# Patient Record
Sex: Male | Born: 1985 | Race: Black or African American | Hispanic: No | Marital: Single | Smoking: Former smoker
Health system: Southern US, Community
[De-identification: ages and names within clinical notes are randomized; demographics above are authoritative.]

## PROBLEM LIST (undated history)

## (undated) ENCOUNTER — Emergency Department (HOSPITAL_COMMUNITY): Admission: EM | Discharge status: Absent without leave | Payer: Self-pay

## (undated) DIAGNOSIS — J45909 Unspecified asthma, uncomplicated: Secondary | ICD-10-CM

## (undated) DIAGNOSIS — K219 Gastro-esophageal reflux disease without esophagitis: Secondary | ICD-10-CM

## (undated) HISTORY — DX: Gastro-esophageal reflux disease without esophagitis: K21.9

---

## 2004-08-04 ENCOUNTER — Emergency Department (HOSPITAL_COMMUNITY): Admission: EM | Admit: 2004-08-04 | Discharge: 2004-08-05 | Payer: Self-pay | Admitting: Emergency Medicine

## 2007-08-05 ENCOUNTER — Emergency Department (HOSPITAL_COMMUNITY): Admission: EM | Admit: 2007-08-05 | Discharge: 2007-08-05 | Payer: Self-pay | Admitting: Family Medicine

## 2008-09-04 ENCOUNTER — Emergency Department (HOSPITAL_COMMUNITY): Admission: EM | Admit: 2008-09-04 | Discharge: 2008-09-04 | Payer: Self-pay | Admitting: Emergency Medicine

## 2008-12-21 ENCOUNTER — Emergency Department (HOSPITAL_COMMUNITY): Admission: EM | Admit: 2008-12-21 | Discharge: 2008-12-21 | Payer: Self-pay | Admitting: Emergency Medicine

## 2009-03-04 IMAGING — CR DG CHEST 2V
2 series · 2 of 2 positions shown · non-contrast
Comparison: none

CLINICAL DATA: 21-yaer-old male with chest pain.
 CHEST ? 2 VIEW:

[w chest pa]
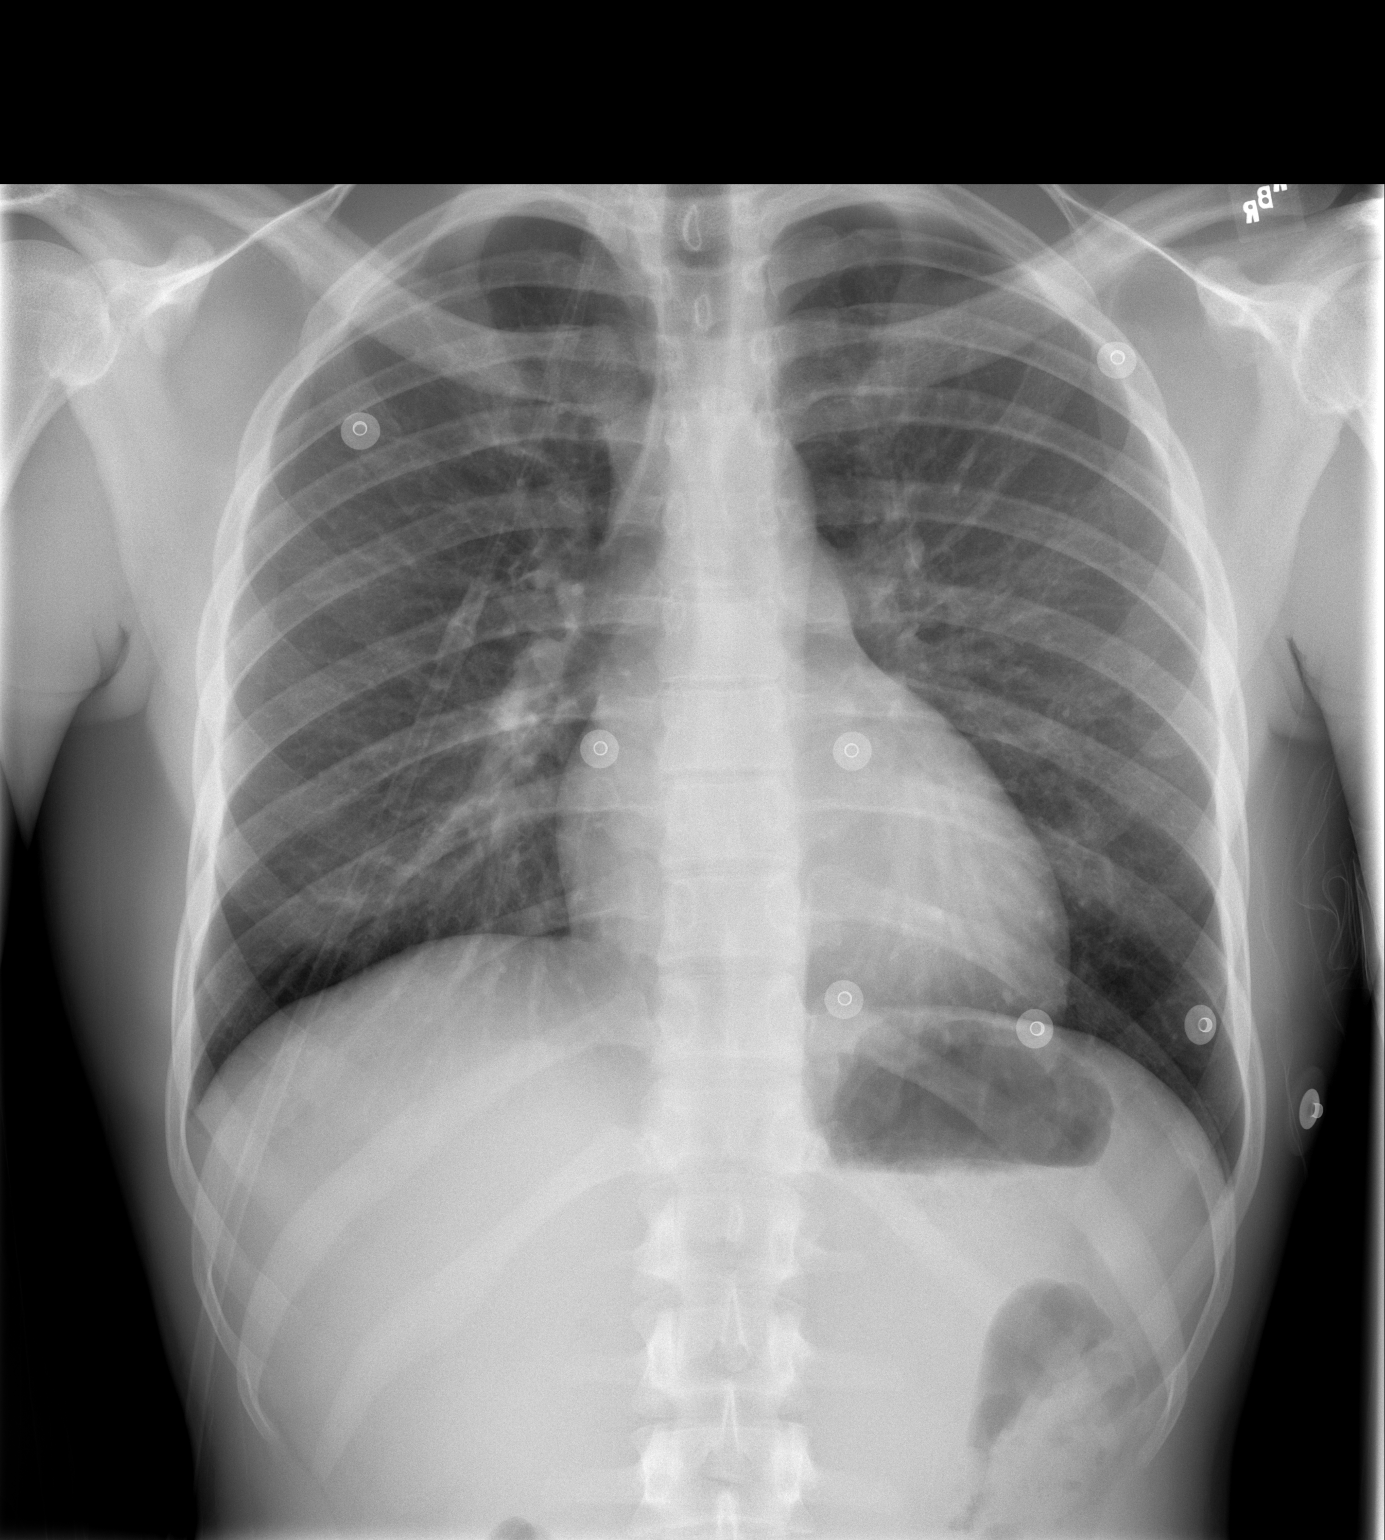

[w chest lat]
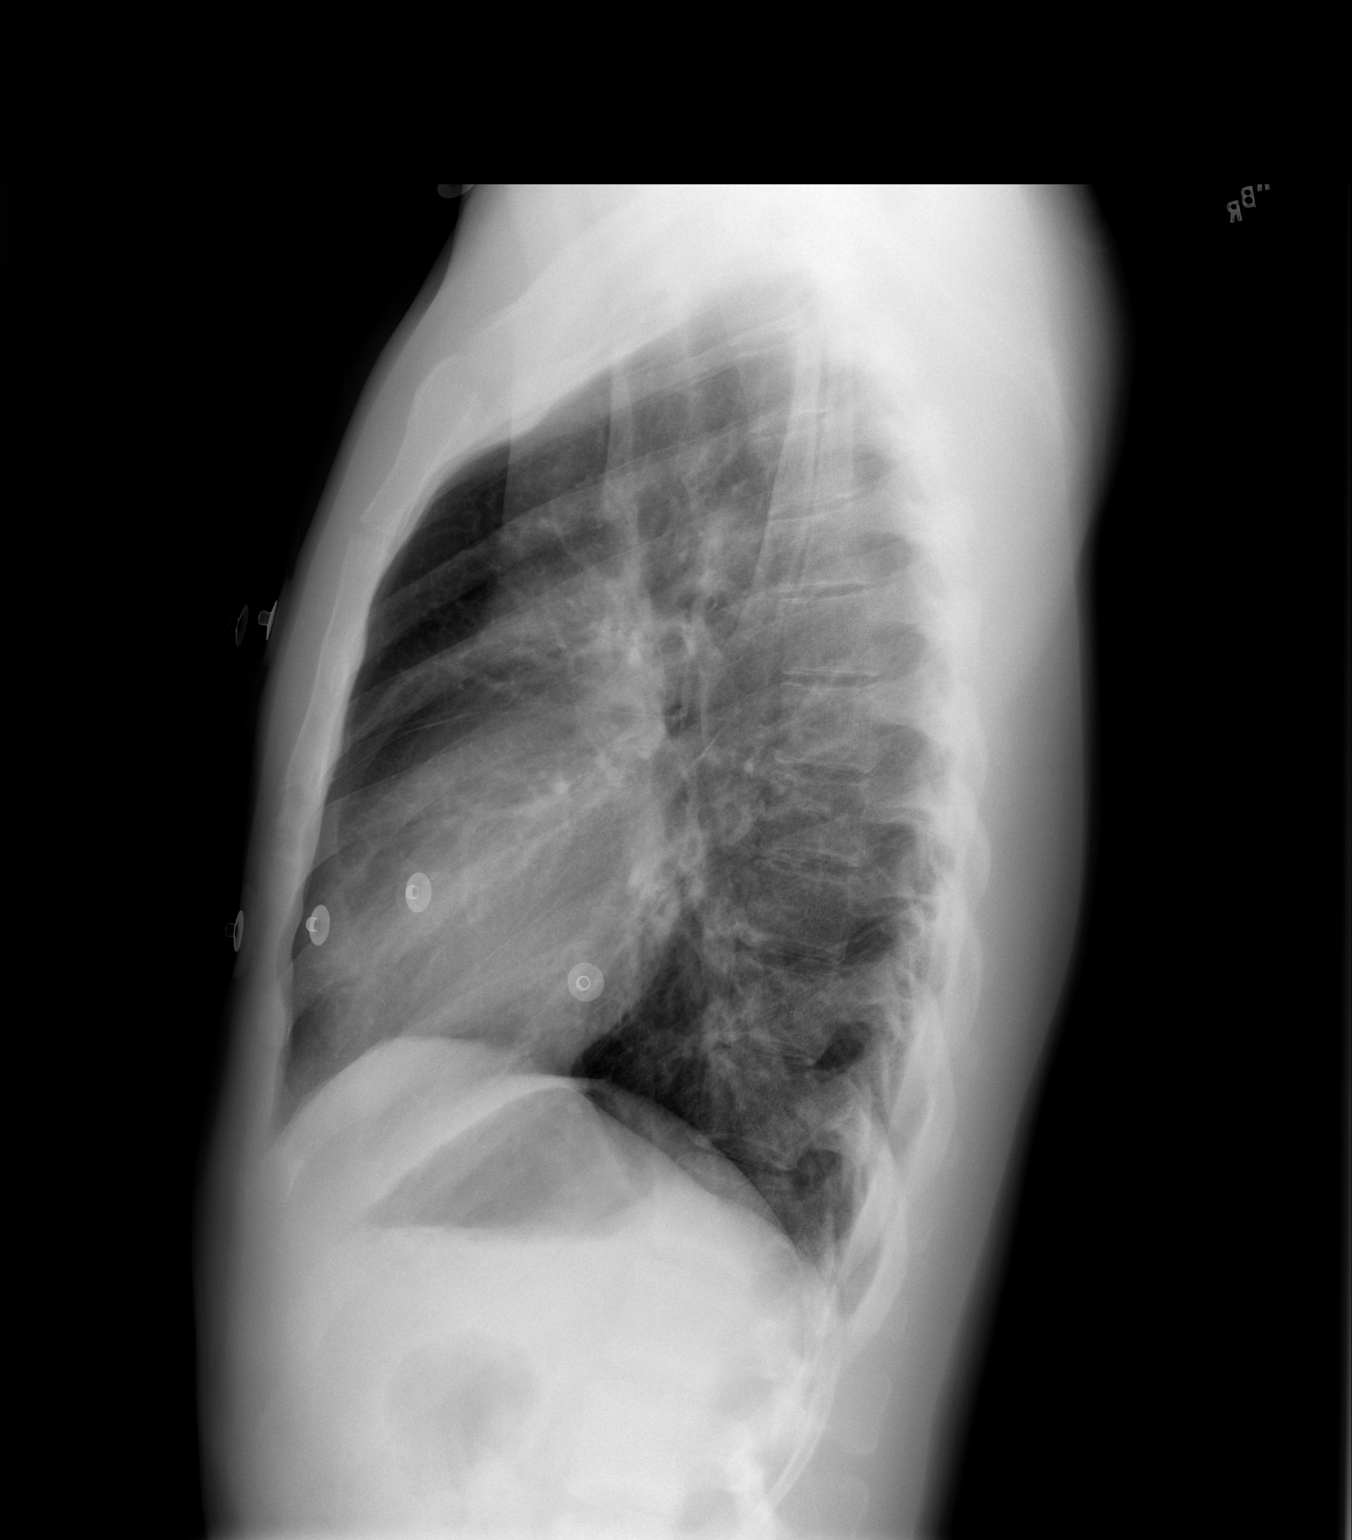

[2 of 2 positions shown; findings below may reference images not displayed]

FINDINGS: The cardio pericardial silhouette is within normal limits for size.  Mild bronchiectatic change is present bilaterally.  No focal airspace disease is present.
IMPRESSION: 1. Mild peribronchial thickening suggestive of an acute viral process.
 2. No focal airspace disease.

## 2009-10-17 ENCOUNTER — Emergency Department (HOSPITAL_COMMUNITY): Admission: EM | Admit: 2009-10-17 | Discharge: 2009-10-17 | Payer: Self-pay | Admitting: Emergency Medicine

## 2009-11-21 ENCOUNTER — Emergency Department (HOSPITAL_COMMUNITY): Admission: EM | Admit: 2009-11-21 | Discharge: 2009-11-21 | Payer: Self-pay | Admitting: Emergency Medicine

## 2009-11-30 ENCOUNTER — Emergency Department (HOSPITAL_COMMUNITY): Admission: EM | Admit: 2009-11-30 | Discharge: 2009-11-30 | Payer: Self-pay | Admitting: Emergency Medicine

## 2010-09-26 LAB — WOUND CULTURE

## 2010-09-26 LAB — GC/CHLAMYDIA PROBE AMP, GENITAL
Chlamydia, DNA Probe: POSITIVE — AB
GC Probe Amp, Genital: POSITIVE — AB

## 2010-09-28 LAB — CULTURE, ROUTINE-ABSCESS

## 2010-10-25 LAB — GC/CHLAMYDIA PROBE AMP, GENITAL: Chlamydia, DNA Probe: NEGATIVE

## 2011-03-30 LAB — POCT CARDIAC MARKERS
CKMB, poc: 1.4
Myoglobin, poc: 102
Myoglobin, poc: 67.1
Operator id: 151321
Troponin i, poc: <0.05

## 2011-03-30 LAB — I-STAT 8, (EC8 V) (CONVERTED LAB)
Acid-Base Excess: 2
Chloride: 104
Glucose, Bld: 93
Potassium: 3.7
Sodium: 139
pH, Ven: 7.365 — ABNORMAL HIGH

## 2011-03-30 LAB — RAPID URINE DRUG SCREEN, HOSP PERFORMED
Amphetamines: NOT DETECTED
Barbiturates: NOT DETECTED
Benzodiazepines: NOT DETECTED
Cocaine: NOT DETECTED
Opiates: NOT DETECTED
Tetrahydrocannabinol: POSITIVE — AB

## 2011-03-30 LAB — POCT I-STAT CREATININE: Operator id: 151321

## 2011-08-27 ENCOUNTER — Encounter (HOSPITAL_COMMUNITY): Payer: Self-pay

## 2011-08-27 ENCOUNTER — Emergency Department (HOSPITAL_COMMUNITY)
Admission: EM | Admit: 2011-08-27 | Discharge: 2011-08-27 | Disposition: A | Discharge status: Home / Self Care | Payer: Self-pay | Attending: Emergency Medicine | Admitting: Emergency Medicine

## 2011-08-27 ENCOUNTER — Emergency Department (HOSPITAL_COMMUNITY): Payer: Self-pay

## 2011-08-27 DIAGNOSIS — S39012A Strain of muscle, fascia and tendon of lower back, initial encounter: Secondary | ICD-10-CM

## 2011-08-27 DIAGNOSIS — M549 Dorsalgia, unspecified: Secondary | ICD-10-CM | POA: Insufficient documentation

## 2011-08-27 DIAGNOSIS — S335XXA Sprain of ligaments of lumbar spine, initial encounter: Secondary | ICD-10-CM | POA: Insufficient documentation

## 2011-08-27 DIAGNOSIS — X503XXA Overexertion from repetitive movements, initial encounter: Secondary | ICD-10-CM | POA: Insufficient documentation

## 2011-08-27 MED ORDER — HYDROCODONE-ACETAMINOPHEN 5-325 MG PO TABS: ORAL_TABLET | ORAL | Status: AC

## 2011-08-27 MED ORDER — NAPROXEN 500 MG PO TABS: 500.0000 mg | ORAL_TABLET | Freq: Two times a day (BID) | ORAL | Status: AC

## 2011-08-27 MED ORDER — METHOCARBAMOL 500 MG PO TABS: ORAL_TABLET | ORAL | Status: DC

## 2011-08-27 NOTE — ED Notes (Signed)
Pt presents with low back pain x 1 week. 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

## 2011-08-27 NOTE — ED Notes (Signed)
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

## 2011-08-27 NOTE — Discharge Instructions (Signed)
Back Pain, Adult Low back pain is very common. About 1 in 5 people have back pain.The cause of low back pain is rarely dangerous. The pain often gets better over time.About half of people with a sudden onset of back pain feel better in just 2 weeks. About 8 in 10 people feel better by 6 weeks.  CAUSES Some common causes of back pain include:  Strain of the muscles or ligaments supporting the spine.   Wear and tear (degeneration) of the spinal discs.   Arthritis.   Direct injury to the back.  DIAGNOSIS Most of the time, the direct cause of low back pain is not known.However, back pain can be treated effectively even when the exact cause of the pain is unknown.Answering your caregiver's questions about your overall health and symptoms is one of the most accurate ways to make sure the cause of your pain is not dangerous. If your caregiver needs more information, he or she may order lab work or imaging tests (X-rays or MRIs).However, even if imaging tests show changes in your back, this usually does not require surgery. HOME CARE INSTRUCTIONS For many people, back pain returns.Since low back pain is rarely dangerous, it is often a condition that people can learn to manageon their own.   Remain active. It is stressful on the back to sit or stand in one place. Do not sit, drive, or stand in one place for more than 30 minutes at a time. Take short walks on level surfaces as soon as pain allows.Try to increase the length of time you walk each day.   Do not stay in bed.Resting more than 1 or 2 days can delay your recovery.   Do not avoid exercise or work.Your body is made to move.It is not dangerous to be active, even though your back may hurt.Your back will likely heal faster if you return to being active before your pain is gone.   Pay attention to your body when you bend and lift. Many people have less discomfortwhen lifting if they bend their knees, keep the load close to their  bodies,and avoid twisting. Often, the most comfortable positions are those that put less stress on your recovering back.   Find a comfortable position to sleep. Use a firm mattress and lie on your side with your knees slightly bent. If you lie on your back, put a pillow under your knees.   Only take over-the-counter or prescription medicines as directed by your caregiver. Over-the-counter medicines to reduce pain and inflammation are often the most helpful.Your caregiver may prescribe muscle relaxant drugs.These medicines help dull your pain so you can more quickly return to your normal activities and healthy exercise.   Put ice on the injured area.   Put ice in a plastic bag.   Place a towel between your skin and the bag.   Leave the ice on for 15 to 20 minutes, 3 to 4 times a day for the first 2 to 3 days. After that, ice and heat may be alternated to reduce pain and spasms.   Ask your caregiver about trying back exercises and gentle massage. This may be of some benefit.   Avoid feeling anxious or stressed.Stress increases muscle tension and can worsen back pain.It is important to recognize when you are anxious or stressed and learn ways to manage it.Exercise is a great option.  SEEK MEDICAL CARE IF:  You have pain that is not relieved with rest or medicine.   You have   pain that does not improve in 1 week.   You have new symptoms.   You are generally not feeling well.  SEEK IMMEDIATE MEDICAL CARE IF:   You have pain that radiates from your back into your legs.   You develop new bowel or bladder control problems.   You have unusual weakness or numbness in your arms or legs.   You develop nausea or vomiting.   You develop abdominal pain.   You feel faint.  Document Released: 06/26/2005 Document Revised: 03/08/2011 Document Reviewed: 11/14/2010 [employer] Patient Information 2012 [employer]. 

## 2011-08-27 NOTE — ED Provider Notes (Signed)
History     CSN: [id number]  Arrival date & time 08/27/11  1112   First MD Initiated Contact with Patient 08/27/11 1208      Chief Complaint  Patient presents with  . Back Pain    (Consider location/radiation/quality/duration/timing/severity/associated sxs/prior treatment) HPI Comments: Patient c/o lower back pain for one week.  Pain began after lifting heavy boxes at work.  Pain is worse with bending over and improves with rest.  He denies fall, numbness or weakness of the legs, incontinence of urine or feces or dysuria.    Patient is a 26 y.o. male presenting with back pain. The history is provided by the patient.  Back Pain  This is a new problem. The current episode started more than 2 days ago. The problem occurs constantly. The problem has not changed since onset.The pain is associated with lifting heavy objects and twisting. The pain is present in the lumbar spine. The quality of the pain is described as aching. The pain does not radiate. The pain is moderate. The symptoms are aggravated by bending, twisting and certain positions. The pain is the same all the time. Pertinent negatives include no numbness, no abdominal pain, no dysuria and no weakness. He has tried nothing for the symptoms. The treatment provided no relief.    History reviewed. No pertinent past medical history.  History reviewed. No pertinent past surgical history.  No family history on file.  History  Substance Use Topics  . Smoking status: Never Smoker   . Smokeless tobacco: Not on file  . Alcohol Use: Yes      Review of Systems  HENT: Negative for neck pain and neck stiffness.   Gastrointestinal: Negative for abdominal pain.  Genitourinary: Negative for dysuria, hematuria, flank pain, decreased urine volume, difficulty urinating and testicular pain.  Musculoskeletal: Positive for back pain. Negative for joint swelling, arthralgias and gait problem.  Skin: Negative.   Neurological: Negative for  weakness and numbness.  All other systems reviewed and are negative.    Allergies  Review of patient's allergies indicates no known allergies.  Home Medications  No current outpatient prescriptions on file.  BP 145/79  Pulse 68  Temp(Src) 97.5 F (36.4 C) (Oral)  Resp 20  Ht 5\' 8"  (1.727 m)  Wt 140 lb (63.504 kg)  BMI 21.29 kg/m2  SpO2 100%  Physical Exam  Nursing note and vitals reviewed. Constitutional: He is oriented to person, place, and time. He appears well-developed and well-nourished. No distress.  HENT:  Head: Normocephalic and atraumatic.  Mouth/Throat: Oropharynx is clear and moist.  Neck: Normal range of motion. Neck supple.  Cardiovascular: Normal rate, regular rhythm, normal heart sounds and intact distal pulses.   No murmur heard. Pulmonary/Chest: Effort normal and breath sounds normal. No respiratory distress.  Musculoskeletal: Normal range of motion. He exhibits no tenderness.       Lumbar back: He exhibits tenderness. He exhibits normal range of motion, no bony tenderness, no swelling, no edema, no deformity, no laceration and normal pulse.       Back:  Neurological: He is alert and oriented to person, place, and time. No cranial nerve deficit. He exhibits normal muscle tone. Coordination and gait normal.  Reflex Scores:      Patellar reflexes are 2+ on the right side and 2+ on the left side.      Achilles reflexes are 2+ on the right side and 2+ on the left side. Skin: Skin is warm and dry.  ED Course  Procedures (including critical care time)       MDM   Patient has tenderness to the lumbar paraspinal muscles. He ambulates with a steady gait. No focal neuro deficits on exam. Likely lumbar strain.  He agrees to f/u with his PMD for recheck.    Patient / Family / Caregiver understand and agree with initial ED impression and plan with expectations set for ED visit. Pt stable in ED with no significant deterioration in condition.           Julis Haubner L. [place], [place] 08/28/11 2019

## 2011-08-29 NOTE — ED Provider Notes (Signed)
Medical screening examination/treatment/procedure(s) were performed by non-physician practitioner and as supervising physician I was immediately available for consultation/collaboration.  Ervin Hensley S. Llewelyn Sheaffer, MD 08/29/11 1458 

## 2012-12-24 ENCOUNTER — Encounter (HOSPITAL_COMMUNITY): Payer: Self-pay | Admitting: Emergency Medicine

## 2012-12-24 ENCOUNTER — Emergency Department (HOSPITAL_COMMUNITY)
Admission: EM | Admit: 2012-12-24 | Discharge: 2012-12-24 | Disposition: A | Discharge status: Home / Self Care | Payer: Self-pay | Attending: Emergency Medicine | Admitting: Emergency Medicine

## 2012-12-24 DIAGNOSIS — Z202 Contact with and (suspected) exposure to infections with a predominantly sexual mode of transmission: Secondary | ICD-10-CM

## 2012-12-24 DIAGNOSIS — F17200 Nicotine dependence, unspecified, uncomplicated: Secondary | ICD-10-CM | POA: Insufficient documentation

## 2012-12-24 DIAGNOSIS — L03211 Cellulitis of face: Secondary | ICD-10-CM | POA: Insufficient documentation

## 2012-12-24 DIAGNOSIS — L0201 Cutaneous abscess of face: Secondary | ICD-10-CM | POA: Insufficient documentation

## 2012-12-24 MED ORDER — SULFAMETHOXAZOLE-TRIMETHOPRIM 800-160 MG PO TABS: 1.0000 | ORAL_TABLET | Freq: Two times a day (BID) | ORAL | Status: DC

## 2012-12-24 MED ORDER — CIPROFLOXACIN HCL 500 MG PO TABS: 500.0000 mg | ORAL_TABLET | Freq: Two times a day (BID) | ORAL | Status: DC

## 2012-12-24 NOTE — ED Provider Notes (Addendum)
History     CSN: [id number]  Arrival date & time 12/24/12  1041   First MD Initiated Contact with Patient 12/24/12 1054      Chief Complaint  Patient presents with  . Abscess  . Exposure to STD    (Consider location/radiation/quality/duration/timing/severity/associated sxs/prior treatment) HPI Comments: Pt states he has developed "another" abscess on the left face. He also states his sexual pardoner notice a "bump" in the privates and he though he should be checked for STD's . No discharge or dysuria, or rash reported. No high  fever. No drainage. Pt has not taken anything for this. He did attempt to squeeze the abscess of the face, but it hurt too much to continue.  Patient is a 27 y.o. male presenting with abscess and STD exposure. The history is provided by the patient.  Abscess Exposure to STD Pertinent negatives include no abdominal pain, arthralgias, chest pain, coughing or neck pain.    History reviewed. No pertinent past medical history.  History reviewed. No pertinent past surgical history.  No family history on file.  History  Substance Use Topics  . Smoking status: Current Every Day Smoker -- 0.50 packs/day    Types: Cigarettes  . Smokeless tobacco: Not on file  . Alcohol Use: Yes      Review of Systems  Constitutional: Negative for activity change.       All ROS Neg except as noted in HPI  HENT: Negative for nosebleeds and neck pain.   Eyes: Negative for photophobia and discharge.  Respiratory: Negative for cough, shortness of breath and wheezing.   Cardiovascular: Negative for chest pain and palpitations.  Gastrointestinal: Negative for abdominal pain and blood in stool.  Genitourinary: Negative for dysuria, frequency, hematuria, discharge, penile swelling, scrotal swelling and penile pain.  Musculoskeletal: Negative for back pain and arthralgias.  Skin: Negative.        abscess  Neurological: Negative for dizziness, seizures and speech difficulty.   Psychiatric/Behavioral: Negative for hallucinations and confusion.    Allergies  Review of patient's allergies indicates no known allergies.  Home Medications   Current Outpatient Rx  Name  Route  Sig  Dispense  Refill  . ciprofloxacin (CIPRO) 500 MG tablet   Oral   Take 1 tablet (500 mg total) by mouth 2 (two) times daily.   14 tablet   0   . sulfamethoxazole-trimethoprim (SEPTRA DS) 800-160 MG per tablet   Oral   Take 1 tablet by mouth every 12 (twelve) hours.   14 tablet   0     BP 152/83  Pulse 77  Temp(Src) 99.5 F (37.5 C)  Resp 18  Ht 5\' 8"  (1.727 m)  Wt 145 lb (65.772 kg)  BMI 22.05 kg/m2  SpO2 100%  Physical Exam  Nursing note and vitals reviewed. Constitutional: He is oriented to person, place, and time. He appears well-developed and well-nourished.  Non-toxic appearance.  HENT:  Head: Normocephalic.  Right Ear: Tympanic membrane and external ear normal.  Left Ear: Tympanic membrane and external ear normal.  nickle size abscess of the left face. No red streaking. No drainage.  Eyes: EOM and lids are normal. Pupils are equal, round, and reactive to light.  Neck: Normal range of motion. Neck supple. Carotid bruit is not present.  Cardiovascular: Normal rate, regular rhythm, normal heart sounds, intact distal pulses and normal pulses.   Pulmonary/Chest: Breath sounds normal. No respiratory distress.  Abdominal: Soft. Bowel sounds are normal. There is no tenderness. There is  no guarding.  Genitourinary: Testes normal. Cremasteric reflex is present. Right testis shows no mass and no tenderness. Left testis shows no mass and no tenderness. Circumcised. No paraphimosis, hypospadias or penile tenderness. No discharge found.  Musculoskeletal: Normal range of motion.  Lymphadenopathy:       Head (right side): No submandibular adenopathy present.       Head (left side): No submandibular adenopathy present.    He has no cervical adenopathy.  Neurological: He is  alert and oriented to person, place, and time. He has normal strength. No cranial nerve deficit or sensory deficit.  Skin: Skin is warm and dry.  Psychiatric: He has a normal mood and affect. His speech is normal.    ED Course  Procedures (including critical care time)  Labs Reviewed  GC/CHLAMYDIA PROBE AMP  RPR   No results found.   1. Abscess of face   2. Possible exposure to STD       MDM  I have reviewed nursing notes, vital signs, and all appropriate lab and imaging results for this patient. The abscess of the left face tender to palpation, but no evidence for severe cellulitis. Rx for ciproa nd septra given. Pt advised to use warm compresses, tylenol and motrin for soreness. Pt to see the surgeon or return to the ED if any changes or problem.       Kathie Dike, PA-C 01/03/13 0934  Kathie Dike, PA-C 01/17/13 1607

## 2012-12-24 NOTE — ED Notes (Signed)
Pt c.o abscess to left face x 3 days. Pt also wants to be checked for std.

## 2012-12-24 NOTE — ED Notes (Signed)
Abscess to lt side of face x3 days,  Also concerned he may have an std., but denies urinary sx or d/c.  Alert, NAD

## 2012-12-25 LAB — GC/CHLAMYDIA PROBE AMP
CT Probe RNA: NEGATIVE
GC Probe RNA: NEGATIVE

## 2013-01-03 NOTE — ED Provider Notes (Signed)
Medical screening examination/treatment/procedure(s) were performed by non-physician practitioner and as supervising physician I was immediately available for consultation/collaboration.  Donnetta Hutching, MD 01/03/13 1550

## 2013-01-18 NOTE — ED Provider Notes (Signed)
Medical screening examination/treatment/procedure(s) were performed by non-physician practitioner and as supervising physician I was immediately available for consultation/collaboration.  Donnetta Hutching, MD 01/18/13 1537

## 2013-03-14 ENCOUNTER — Emergency Department (HOSPITAL_COMMUNITY)
Admission: EM | Admit: 2013-03-14 | Discharge: 2013-03-14 | Disposition: A | Discharge status: Home / Self Care | Payer: Self-pay | Attending: Emergency Medicine | Admitting: Emergency Medicine

## 2013-03-14 ENCOUNTER — Encounter (HOSPITAL_COMMUNITY): Payer: Self-pay | Admitting: Emergency Medicine

## 2013-03-14 DIAGNOSIS — F17200 Nicotine dependence, unspecified, uncomplicated: Secondary | ICD-10-CM | POA: Insufficient documentation

## 2013-03-14 DIAGNOSIS — K5289 Other specified noninfective gastroenteritis and colitis: Secondary | ICD-10-CM | POA: Insufficient documentation

## 2013-03-14 DIAGNOSIS — K529 Noninfective gastroenteritis and colitis, unspecified: Secondary | ICD-10-CM

## 2013-03-14 MED ORDER — PROMETHAZINE HCL 12.5 MG PO TABS: 12.5000 mg | ORAL_TABLET | Freq: Four times a day (QID) | ORAL | Status: DC | PRN

## 2013-03-14 NOTE — ED Notes (Signed)
PA at bedside.

## 2013-03-14 NOTE — ED Provider Notes (Signed)
CSN: [id number]     Arrival date & time 03/14/13  0904 History   First MD Initiated Contact with Patient 03/14/13 [phone]     Chief Complaint  Patient presents with  . Abdominal Pain   (Consider location/radiation/quality/duration/timing/severity/associated sxs/prior Treatment) Patient is a 27 y.o. male presenting with abdominal pain. The history is provided by the patient.  Abdominal Pain Pain location:  Epigastric Pain quality comment:  Feels like a knot in the upper stomach Pain radiates to:  Does not radiate Pain severity:  Moderate Onset quality:  Gradual Duration:  1 day Timing:  Intermittent Progression:  Unchanged Chronicity:  New Context: sick contacts   Relieved by:  Nothing Ineffective treatments:  Acetaminophen Associated symptoms: diarrhea and vomiting   Associated symptoms: no belching, no chest pain, no chills, no constipation, no cough, no dysuria, no fever, no hematuria and no shortness of breath   Risk factors: alcohol abuse and NSAID use   Risk factors: no aspirin use and has not had multiple surgeries     History reviewed. No pertinent past medical history. History reviewed. No pertinent past surgical history. History reviewed. No pertinent family history. History  Substance Use Topics  . Smoking status: Current Every Day Smoker -- 0.50 packs/day    Types: Cigarettes  . Smokeless tobacco: Not on file  . Alcohol Use: Yes     Comment: occ    Review of Systems  Constitutional: Negative for fever, chills and activity change.       All ROS Neg except as noted in HPI  HENT: Negative for nosebleeds and neck pain.   Eyes: Negative for photophobia and discharge.  Respiratory: Negative for cough, shortness of breath and wheezing.   Cardiovascular: Negative for chest pain and palpitations.  Gastrointestinal: Positive for vomiting, abdominal pain and diarrhea. Negative for constipation and blood in stool.  Genitourinary: Negative for dysuria, frequency and  hematuria.  Musculoskeletal: Negative for back pain and arthralgias.  Skin: Negative.   Neurological: Negative for dizziness, seizures and speech difficulty.  Psychiatric/Behavioral: Negative for hallucinations and confusion.    Allergies  Review of patient's allergies indicates no known allergies.  Home Medications   Current Outpatient Rx  Name  Route  Sig  Dispense  Refill  . ciprofloxacin (CIPRO) 500 MG tablet   Oral   Take 1 tablet (500 mg total) by mouth 2 (two) times daily.   14 tablet   0   . sulfamethoxazole-trimethoprim (SEPTRA DS) 800-160 MG per tablet   Oral   Take 1 tablet by mouth every 12 (twelve) hours.   14 tablet   0    BP 138/79  Pulse 73  Temp(Src) 98.8 F (37.1 C) (Oral)  Resp 19  SpO2 100% Physical Exam  Nursing note and vitals reviewed. Constitutional: He is oriented to person, place, and time. He appears well-developed and well-nourished.  Non-toxic appearance.  HENT:  Head: Normocephalic.  Right Ear: Tympanic membrane and external ear normal.  Left Ear: Tympanic membrane and external ear normal.  Eyes: EOM and lids are normal. Pupils are equal, round, and reactive to light.  Neck: Normal range of motion. Neck supple. Carotid bruit is not present.  Cardiovascular: Normal rate, regular rhythm, normal heart sounds, intact distal pulses and normal pulses.   Pulmonary/Chest: Breath sounds normal. No respiratory distress.  Abdominal: Soft. Bowel sounds are normal. There is no tenderness. There is no guarding.  Musculoskeletal: Normal range of motion.  Lymphadenopathy:       Head (right side):  No submandibular adenopathy present.       Head (left side): No submandibular adenopathy present.    He has no cervical adenopathy.  Neurological: He is alert and oriented to person, place, and time. He has normal strength. No cranial nerve deficit or sensory deficit.  Skin: Skin is warm and dry.  Psychiatric: He has a normal mood and affect. His speech is  normal.    ED Course  Procedures (including critical care time) Labs Review Labs Reviewed - No data to display Imaging Review No results found.  MDM  No diagnosis found. **I have reviewed nursing notes, vital signs, and all appropriate lab and imaging results for this patient.*  Vital signs reviewed. Exam and hx c/w gastroenteritis. Rx for promethazine given to the patient. Pt to return if not improving on clear liquid diet and rest.  Kathie Dike, PA-C 03/17/13 1717

## 2013-03-14 NOTE — ED Notes (Signed)
Pt c/o upper abd pain since yesterday. N/v yesterday x2, none today. Diarrhea x3 yesterday and x 2 today. Mm wet. Nad.

## 2013-03-14 NOTE — ED Notes (Signed)
Patient with no complaints at this time. Respirations even and unlabored. Skin warm/dry. Discharge instructions reviewed with patient at this time. Patient given opportunity to voice concerns/ask questions. Patient discharged at this time and left Emergency Department with steady gait.   

## 2013-03-18 NOTE — ED Provider Notes (Signed)
Medical screening examination/treatment/procedure(s) were performed by non-physician practitioner and as supervising physician I was immediately available for consultation/collaboration.    Vida Roller, MD 03/18/13 1149

## 2013-03-26 IMAGING — CR DG LUMBAR SPINE COMPLETE 4+V
5 series · 5 of 5 positions shown · non-contrast
Comparison: None.

CLINICAL DATA: Back pain after lifting

LUMBAR SPINE - COMPLETE 4+ VIEW

[view not recorded (1 of 5)]
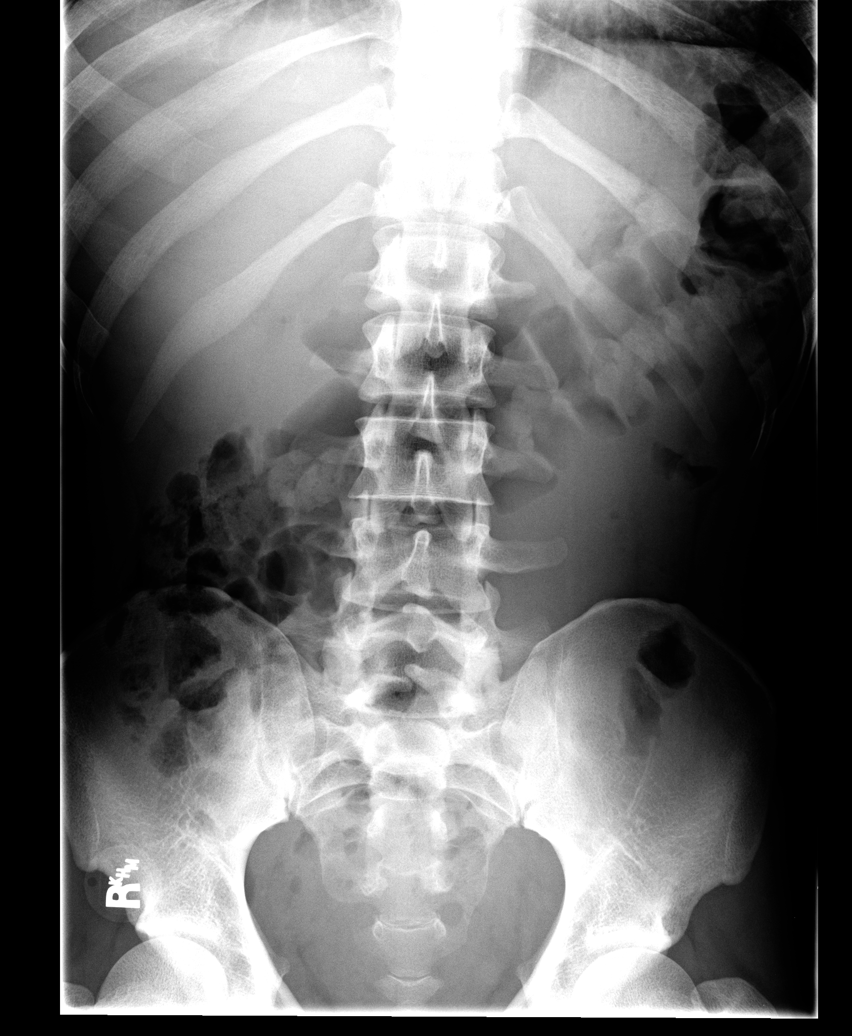

[view not recorded (2 of 5)]
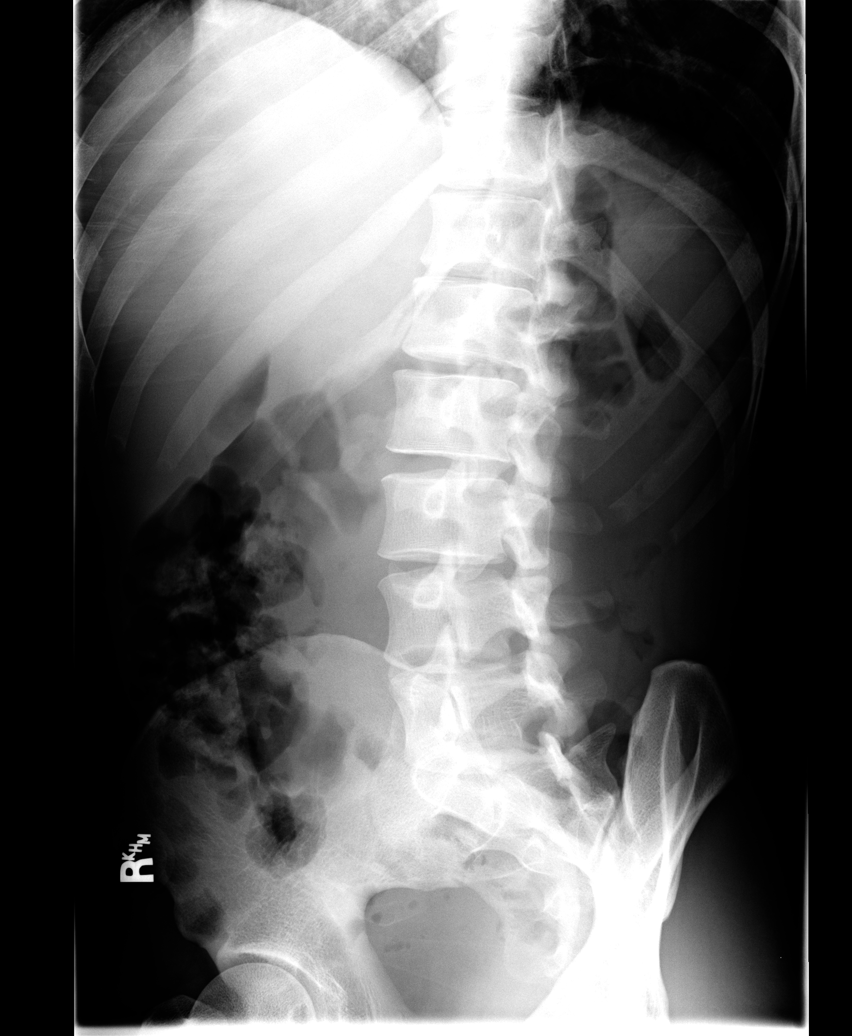

[view not recorded (3 of 5)]
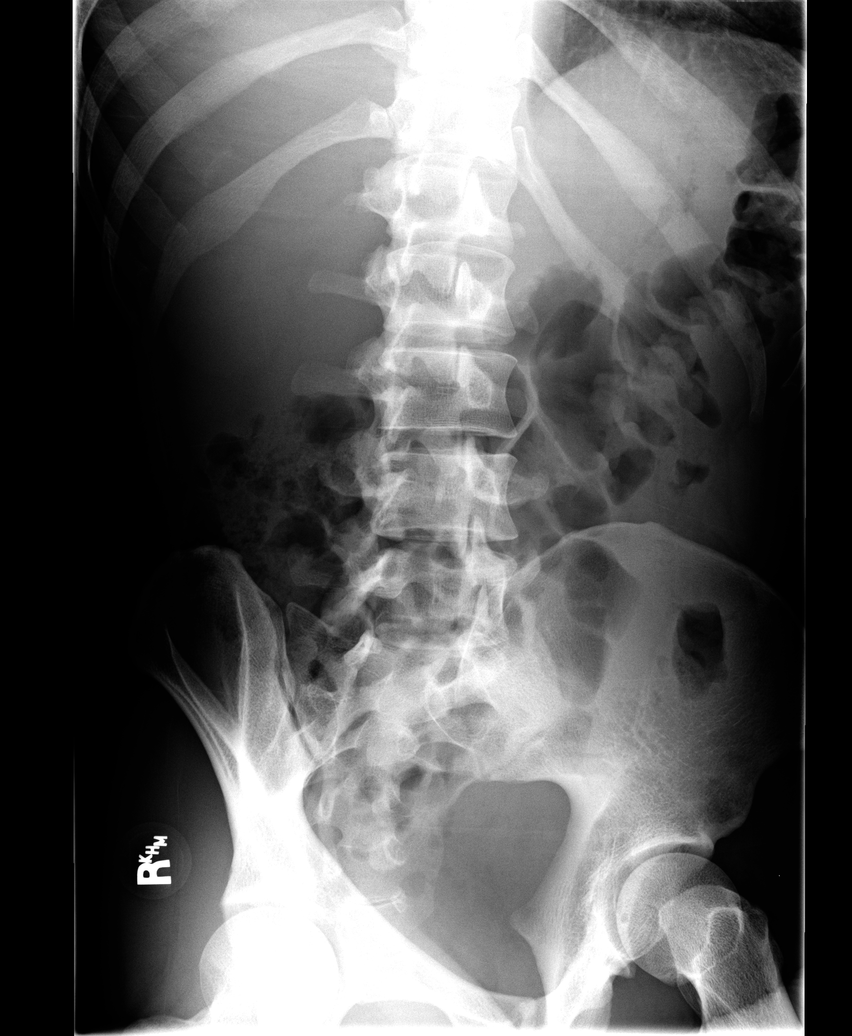

[view not recorded (4 of 5)]
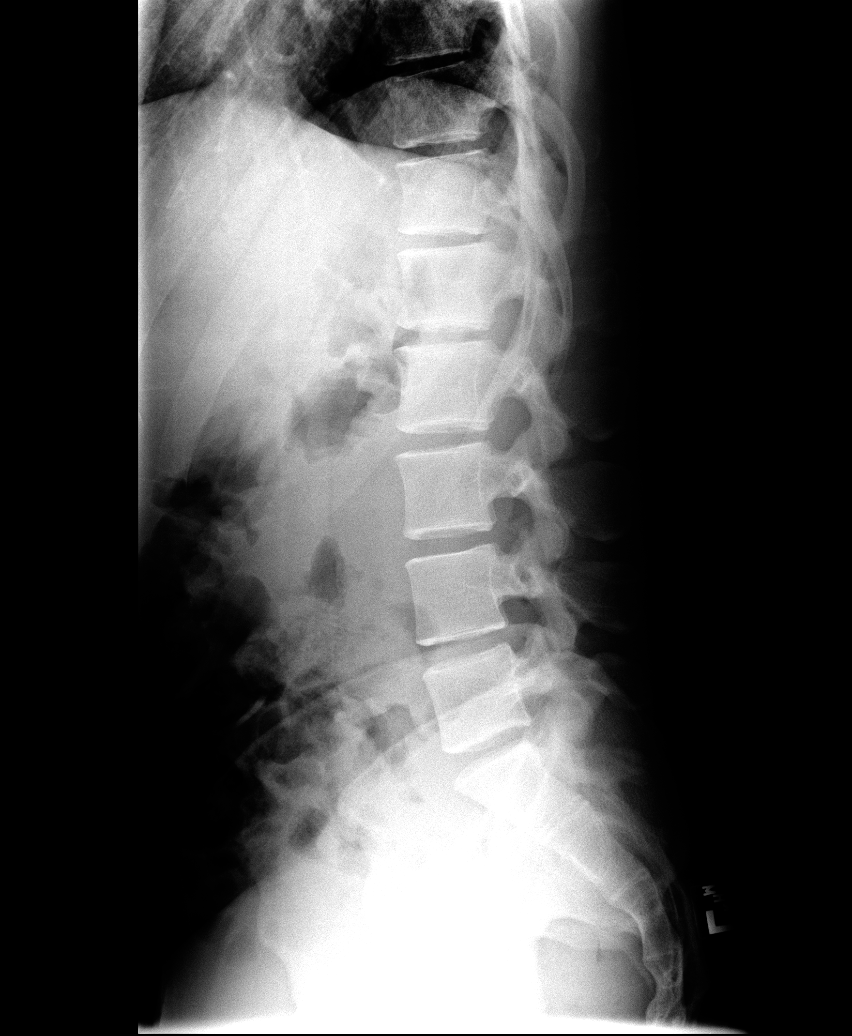

[view not recorded (5 of 5)]
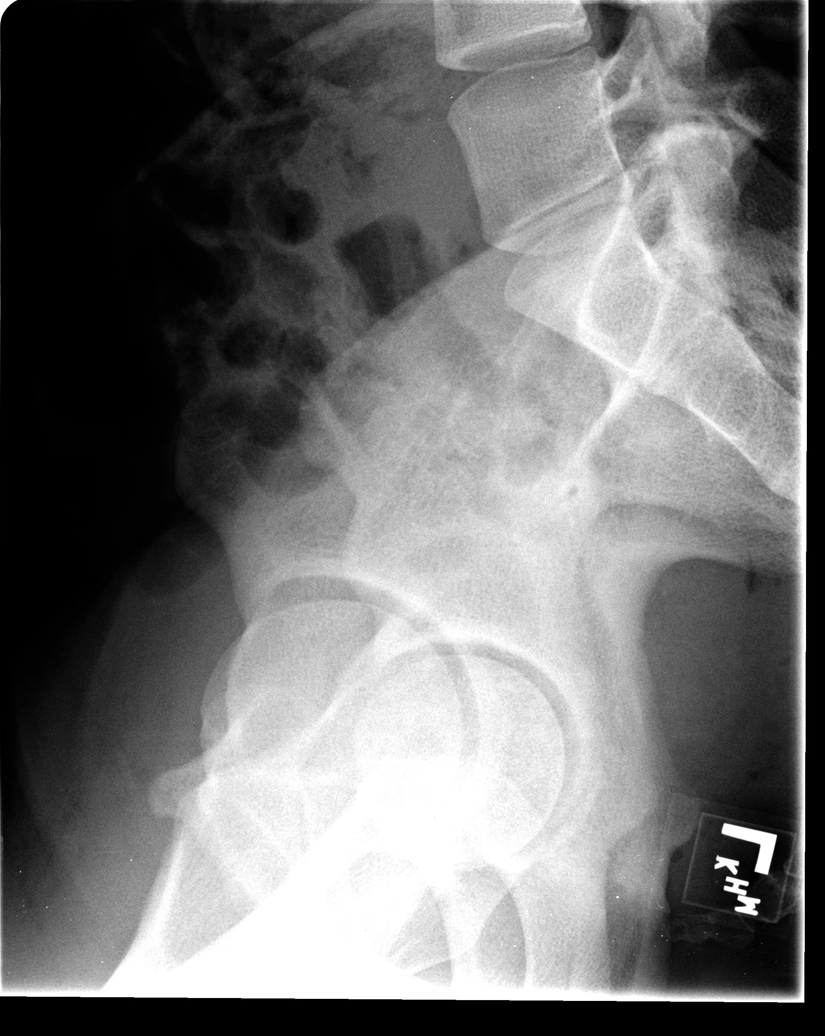

[5 of 5 positions shown; findings below may reference images not displayed]

FINDINGS: There is no evidence of lumbar spine fracture.  Alignment
is normal.  Intervertebral disc spaces are maintained.
IMPRESSION: Negative.

## 2013-05-05 ENCOUNTER — Emergency Department (HOSPITAL_COMMUNITY): Payer: Worker's Compensation

## 2013-05-05 ENCOUNTER — Encounter (HOSPITAL_COMMUNITY): Payer: Self-pay | Admitting: Emergency Medicine

## 2013-05-05 ENCOUNTER — Emergency Department (HOSPITAL_COMMUNITY)
Admission: EM | Admit: 2013-05-05 | Discharge: 2013-05-05 | Disposition: A | Discharge status: Home / Self Care | Payer: Worker's Compensation | Attending: Emergency Medicine | Admitting: Emergency Medicine

## 2013-05-05 DIAGNOSIS — X500XXA Overexertion from strenuous movement or load, initial encounter: Secondary | ICD-10-CM | POA: Insufficient documentation

## 2013-05-05 DIAGNOSIS — S46912A Strain of unspecified muscle, fascia and tendon at shoulder and upper arm level, left arm, initial encounter: Secondary | ICD-10-CM

## 2013-05-05 DIAGNOSIS — S39012A Strain of muscle, fascia and tendon of lower back, initial encounter: Secondary | ICD-10-CM

## 2013-05-05 DIAGNOSIS — IMO0002 Reserved for concepts with insufficient information to code with codable children: Secondary | ICD-10-CM | POA: Insufficient documentation

## 2013-05-05 DIAGNOSIS — F17200 Nicotine dependence, unspecified, uncomplicated: Secondary | ICD-10-CM | POA: Insufficient documentation

## 2013-05-05 DIAGNOSIS — Y990 Civilian activity done for income or pay: Secondary | ICD-10-CM | POA: Insufficient documentation

## 2013-05-05 DIAGNOSIS — S335XXA Sprain of ligaments of lumbar spine, initial encounter: Secondary | ICD-10-CM | POA: Insufficient documentation

## 2013-05-05 DIAGNOSIS — Y9389 Activity, other specified: Secondary | ICD-10-CM | POA: Insufficient documentation

## 2013-05-05 DIAGNOSIS — Y9289 Other specified places as the place of occurrence of the external cause: Secondary | ICD-10-CM | POA: Insufficient documentation

## 2013-05-05 MED ORDER — CYCLOBENZAPRINE HCL 5 MG PO TABS: 5.0000 mg | ORAL_TABLET | Freq: Three times a day (TID) | ORAL | Status: DC | PRN

## 2013-05-05 MED ORDER — IBUPROFEN 600 MG PO TABS: 600.0000 mg | ORAL_TABLET | Freq: Four times a day (QID) | ORAL | Status: DC | PRN

## 2013-05-05 NOTE — ED Notes (Signed)
Pt reports injuring his low back and left shoulder while at work on sat. Was pulling something out of a machine.

## 2013-05-05 NOTE — ED Notes (Signed)
Pt alert & oriented x4, stable gait. Patient given discharge instructions, paperwork & prescription(s). Patient  instructed to stop at the registration desk to finish any additional paperwork. Patient verbalized understanding. Pt left department w/ no further questions. 

## 2013-05-05 NOTE — ED Provider Notes (Signed)
CSN: [id number]     Arrival date & time 05/05/13  0807 History   First MD Initiated Contact with Patient 05/05/13 0815     Chief Complaint  Patient presents with  . Back Pain  . Shoulder Pain   (Consider location/radiation/quality/duration/timing/severity/associated sxs/prior Treatment) HPI Comments: Lee Meyer is a 27 y.o. Male presenting with left shoulder and lower back pain after injury sustained when pulling forcefully trying to unlodge something from a machine at work 2 days ago.  He denies prior pain or injury in his lower back,  But has had left shoulder injury in the past which was never evaluated.  He denies weakness or numbness in his extremities and denies urinary or fecal incontinence or retention.  He has used icy hot on his shoulder without relief of symptoms.  Pain is better at rest,  Triggered with movement.     The history is provided by the patient.    History reviewed. No pertinent past medical history. History reviewed. No pertinent past surgical history. No family history on file. History  Substance Use Topics  . Smoking status: Current Every Day Smoker -- 0.50 packs/day    Types: Cigarettes  . Smokeless tobacco: Not on file  . Alcohol Use: Yes     Comment: occ    Review of Systems  Constitutional: Negative for fever.  Musculoskeletal: Positive for arthralgias and back pain. Negative for joint swelling and myalgias.  Neurological: Negative for weakness and numbness.    Allergies  Review of patient's allergies indicates no known allergies.  Home Medications   Current Outpatient Rx  Name  Route  Sig  Dispense  Refill  . cyclobenzaprine (FLEXERIL) 5 MG tablet   Oral   Take 1 tablet (5 mg total) by mouth 3 (three) times daily as needed for muscle spasms.   15 tablet   0   . ibuprofen (ADVIL,MOTRIN) 600 MG tablet   Oral   Take 1 tablet (600 mg total) by mouth every 6 (six) hours as needed for pain.   30 tablet   0    BP 133/79  SpO2  100% Physical Exam  Nursing note and vitals reviewed. Constitutional: He appears well-developed and well-nourished.  HENT:  Head: Normocephalic.  Eyes: Conjunctivae are normal.  Neck: Normal range of motion. Neck supple.  Cardiovascular: Normal rate and intact distal pulses.   Pedal pulses normal.  Pulmonary/Chest: Effort normal.  Abdominal: Soft. Bowel sounds are normal. He exhibits no distension and no mass.  Musculoskeletal: Normal range of motion. He exhibits no edema.       Left shoulder: He exhibits bony tenderness. He exhibits normal range of motion, no swelling, no effusion, no crepitus, no deformity, no spasm, normal pulse and normal strength.       Lumbar back: He exhibits tenderness. He exhibits no swelling, no edema and no spasm.  Left shoulder ttp across lateral clavicle and upper humerus.  No deformity.  Neurological: He is alert. He has normal strength. He displays no atrophy and no tremor. No sensory deficit. Gait normal.  Reflex Scores:      Patellar reflexes are 2+ on the right side and 2+ on the left side.      Achilles reflexes are 2+ on the right side and 2+ on the left side. No strength deficit noted in hip and knee flexor and extensor muscle groups.  Ankle flexion and extension intact.  Skin: Skin is warm and dry.  Psychiatric: He has a normal mood and  affect.    ED Course  Procedures (including critical care time) Labs Review Labs Reviewed - No data to display Imaging Review Dg Lumbar Spine Complete  05/05/2013   CLINICAL DATA:  low back pain  EXAM: LUMBAR SPINE - COMPLETE 4+ VIEW  COMPARISON:  August 27, 2011  FINDINGS: Frontal, lateral, spot lumbosacral lateral, and bilateral oblique views were obtained. There are 5 non-rib-bearing lumbar type vertebral bodies. There is no fracture or spondylolisthesis. Disk spaces appear intact. There is no appreciable facet arthropathy. 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  IMPRESSION: No fracture or  spondylolisthesis. No appreciable arthropathic change.   Electronically Signed   By: Bretta Bang M.D.   On: 05/05/2013 09:03   Dg Shoulder Left  05/05/2013   CLINICAL DATA:  Superior shoulder pain, work related injury  EXAM: LEFT SHOULDER - 2+ VIEW  COMPARISON:  None.  FINDINGS: No fracture or dislocation is seen.  The joint spaces are preserved.  The visualized soft tissues are unremarkable.  Visualized left lung is clear.  IMPRESSION: No fracture or dislocation is seen.   Electronically Signed   By: Charline Bills M.D.   On: 05/05/2013 09:03    EKG Interpretation   None       MDM   1. Lumbar strain, initial encounter   2. Shoulder strain, left, initial encounter    Patients labs and/or radiological studies were viewed and considered during the medical decision making and disposition process.  Pt prescribed flexeril, ibuprofen.  No neuro deficit on exam or by history to suggest emergent or surgical presentation.  Also discussed worsened sx that should prompt immediate re-evaluation including distal weakness, bowel/bladder retention/incontinence.  Referral to ortho for a recheck if not improved over the next week.         Burgess Amor, PA-C 05/05/13 [phone]

## 2013-05-05 NOTE — ED Provider Notes (Signed)
Medical screening examination/treatment/procedure(s) were performed by non-physician practitioner and as supervising physician I was immediately available for consultation/collaboration.  EKG Interpretation   None      '  Trecia Maring L Ercil Cassis, MD 05/05/13 1434 

## 2013-09-29 ENCOUNTER — Emergency Department (HOSPITAL_COMMUNITY)
Admission: EM | Admit: 2013-09-29 | Discharge: 2013-09-29 | Disposition: A | Discharge status: Home / Self Care | Payer: Self-pay | Attending: Emergency Medicine | Admitting: Emergency Medicine

## 2013-09-29 ENCOUNTER — Encounter (HOSPITAL_COMMUNITY): Payer: Self-pay | Admitting: Emergency Medicine

## 2013-09-29 DIAGNOSIS — Z202 Contact with and (suspected) exposure to infections with a predominantly sexual mode of transmission: Secondary | ICD-10-CM | POA: Insufficient documentation

## 2013-09-29 DIAGNOSIS — F17200 Nicotine dependence, unspecified, uncomplicated: Secondary | ICD-10-CM | POA: Insufficient documentation

## 2013-09-29 LAB — HIV ANTIBODY (ROUTINE TESTING W REFLEX): HIV: NONREACTIVE

## 2013-09-29 MED ORDER — CEFTRIAXONE SODIUM 250 MG IJ SOLR
250.0000 mg | Freq: Once | INTRAMUSCULAR | Status: AC
  Administered 2013-09-29: 250 mg via INTRAMUSCULAR
  Filled 2013-09-29: qty 250

## 2013-09-29 MED ORDER — LIDOCAINE HCL (PF) 1 % IJ SOLN
INTRAMUSCULAR | Status: AC
  Administered 2013-09-29: 0.9 mL
  Filled 2013-09-29: qty 5

## 2013-09-29 MED ORDER — AZITHROMYCIN 1 G PO PACK
1.0000 g | PACK | Freq: Once | ORAL | Status: AC
  Administered 2013-09-29: 1 g via ORAL
  Filled 2013-09-29: qty 1

## 2013-09-29 NOTE — ED Notes (Signed)
Pt here for STD check. Denies sx.

## 2013-09-29 NOTE — ED Provider Notes (Signed)
CSN: [id number]     Arrival date & time 09/29/13  0754 History   First MD Initiated Contact with Patient 09/29/13 0813     Chief Complaint  Patient presents with  . SEXUALLY TRANSMITTED DISEASE     (Consider location/radiation/quality/duration/timing/severity/associated sxs/prior Treatment) The history is provided by the patient. No language interpreter was used.  Pt is a 28 year old male who presents this am desiring STD testing. He reports that his partner tested positive for chlamydia. He denies any dysuria, penile burning or discharge, abdominal pain or back pain. He reports that he has been feeling fine, no recent illness. He reports that this has been a long term partner and she tested positive 2 days ago.   History reviewed. No pertinent past medical history. History reviewed. No pertinent past surgical history. No family history on file. History  Substance Use Topics  . Smoking status: Current Every Day Smoker -- 0.50 packs/day    Types: Cigarettes  . Smokeless tobacco: Not on file  . Alcohol Use: Yes     Comment: occ    Review of Systems  Constitutional: Negative for chills.  Genitourinary: Negative for dysuria, urgency, discharge, penile swelling, scrotal swelling, difficulty urinating, penile pain and testicular pain.  Musculoskeletal: Negative for back pain.  Neurological: Negative for weakness.  All other systems reviewed and are negative.      Allergies  Review of patient's allergies indicates no known allergies.  Home Medications  No current outpatient prescriptions on file. BP 139/69  Pulse 73  Temp(Src) 98.1 F (36.7 C)  Resp 20  Ht 5\' 8"  (1.727 m)  Wt 145 lb (65.772 kg)  BMI 22.05 kg/m2  SpO2 100% Physical Exam  Nursing note and vitals reviewed. Constitutional: He is oriented to person, place, and time. He appears well-developed and well-nourished. No distress.  HENT:  Head: Normocephalic and atraumatic.  Eyes: Conjunctivae and EOM are normal.   Cardiovascular: Normal rate, regular rhythm and normal heart sounds.   Pulmonary/Chest: Effort normal and breath sounds normal. No respiratory distress. He has no wheezes.  Abdominal: Soft. Bowel sounds are normal. He exhibits no distension. There is no tenderness.  Genitourinary: Penis normal. Circumcised. No penile erythema or penile tenderness. No discharge found.  Musculoskeletal: Normal range of motion.  Neurological: He is alert and oriented to person, place, and time.  Skin: Skin is warm and dry. No rash noted.  Psychiatric: He has a normal mood and affect. His behavior is normal. Judgment and thought content normal.    ED Course  Procedures (including critical care time) Labs Review Labs Reviewed - No data to display Imaging Review No results found.   EKG Interpretation None      MDM   Final diagnoses:  Exposure to STD     Exposure to STD, reports that his partner tested positive for chlamydia. Pt asymptomatic. No dysuria, penile pain or discharge. Discussed plan of care with pt. Treated with Azithromycin 1 gm po and ceftriaxone 250 mg IM. Return precautions given.     Irish EldersKelly Usama Harkless, NP 10/01/13 2224

## 2013-09-29 NOTE — ED Notes (Signed)
nad noted prior to dc. Dc instructions reviewed and explained. Voiced understanding. Ambulated out without difficulty.  

## 2013-09-29 NOTE — Discharge Instructions (Signed)
Chlamydia, Females and Males Chlamydia is an infection that can be found in the vagina, urethra, cervix, rectum and pelvic organs in the male. In the male, it most often causes urethritis. This happens when it infects the tube (urethra) that carries the urine out of the bladder. When Chlamydia causes urethritis, there may be burning with urination. In males, it may also infect the tubes that carry the sperm from the testicle. This causes pain in the testicles and infect the prostate gland. In females, an infection of the pelvic organs is also called PID (pelvic inflammatory disease). PID may be a cause of sudden (acute) lower abdominal/belly (pelvic) pain and fever. But with Chlamydia, the infection sometimes does not cause problems that you notice (asymptomatic). It may cause an abnormal or watery mucous-like discharge from the birth canal (vagina) or penis.  CAUSES  Chlamydia is caused by germs (bacteria) that are spread during sexual contact of the:  Genitals.  Mouth.  Rectum. This infection may also be passed to a newborn baby coming through the infected birth canal. This causes eye and lung infections in the baby. Chlamydia often goes unnoticed. So it is easy to transmit it to a sexual partner without even knowing. SYMPTOMS  In females, symptoms may go unnoticed. Symptoms that are more noticeable can include:  Belly (abdominal) pain.  Painful intercourse.  Watery mucous-like discharge from the vagina.  Miscarriage.  Discomfort when urinating.  Inflammation of the rectum. In males, symptoms include:  Burning with urination.  Pain in the testicles.  Watery mucous-like discharge from the penis. It can cause longstanding (chronic) pelvic pain after frequent infections. TREATMENT   Chlamydia can be treated with medications which kill germs(antibiotics).  Inform all sexual partners about the infection. All sexual contacts need to be treated.  If you are pregnant, do not take  tetracycline type antibiotics.  PID can cause women to not be able to have children (sterile) if left untreated or if half-treated. It does this by scarring the tubes to the uterus (fallopian tubes). They carry the egg needed to form a baby. It is important to finish ALL medications given to you.  Sterility or future tubal (ectopic) pregnancies can occur in fully treated individuals. It is important to follow your prescribed treatment. That will lessen the chances of these problems.  This is a sexually transmitted infection. So you are also at risk for other sexually transmitted diseases. These include: Gonorrhea and HIV (AIDS). Testing may be done for the other sexually transmitted diseases if one disease is detected.  It is important to treat chlamydia as soon as possible. It can cause damage to other organs. HOME CARE INSTRUCTIONS  Finish all medication as prescribed. Incomplete treatment will put you at risk for not being able to have children (sterility) and tubal pregnancy. If one sexually transmitted disease is discovered, often treatment will be started to cover other possible infections.  Only take over-the-counter or prescription medicines for pain, discomfort, or fever as directed by your caregiver.  Rest.  Eat a balanced diet and drink plenty of fluids.  Warning: This infection is contagious. Do not have sex until treatment is completed. Follow up at your caregiver's office or the clinic to which you were referred. If your diagnosis (learning what is wrong) is confirmed by culture or some other method, your recent sexual contacts need treatment. Even if they are symptom free or have a negative culture or evaluation, they should be treated.  For the protection of your privacy,  test results can not be given over the phone. Make sure you receive the results of your test. Ask how these results are to be obtained if you have not been informed. It is your responsibility to obtain your test  results. PREVENTION   Women should use sanitary pads instead of tampons for vaginal discharge.  Wipe front to back after using the toilet and avoid douching.  Test for chlamydia if you are having an IUD inserted.  Practice safe sex, use condoms, have only one sex partner and be sure your sex partner is not having sex with others.  Ask your caregiver to test you for chlamydia at your regular checkups or sooner if you are having symptoms.  Ask for further information if you are pregnant. SEEK IMMEDIATE MEDICAL CARE IF:   You develop an oral temperature above 102 F (38.9 C), not controlled by medications or lasting more than 2 days.  You develop an increase in pain.  You develop any type of abnormal discharge.  You develop vaginal bleeding and it is not time for your period.  You develop painful intercourse. MAKE SURE YOU:   Understand these instructions.  Will watch your condition.  Will get help right away if you are not doing well or get worse. Document Released: 06/26/2005 Document Revised: 09/18/2011 Document Reviewed: 01/02/2013 [facility] Patient Information 2014 [employer], [place].   Return if symptoms worsen You were treated here today for this

## 2013-09-30 LAB — GC/CHLAMYDIA PROBE AMP
CT PROBE, AMP APTIMA: NEGATIVE
GC PROBE AMP APTIMA: NEGATIVE

## 2013-10-06 NOTE — ED Provider Notes (Signed)
Medical screening examination/treatment/procedure(s) were performed by non-physician practitioner and as supervising physician I was immediately available for consultation/collaboration.   EKG Interpretation None        Lasondra Hodgkins W. Bentlee Benningfield, MD 10/06/13 0731 

## 2013-11-07 ENCOUNTER — Emergency Department (HOSPITAL_COMMUNITY)
Admission: EM | Admit: 2013-11-07 | Discharge: 2013-11-07 | Disposition: A | Discharge status: Home / Self Care | Payer: Self-pay | Attending: Emergency Medicine | Admitting: Emergency Medicine

## 2013-11-07 ENCOUNTER — Encounter (HOSPITAL_COMMUNITY): Payer: Self-pay | Admitting: Emergency Medicine

## 2013-11-07 DIAGNOSIS — K529 Noninfective gastroenteritis and colitis, unspecified: Secondary | ICD-10-CM

## 2013-11-07 DIAGNOSIS — F17200 Nicotine dependence, unspecified, uncomplicated: Secondary | ICD-10-CM | POA: Insufficient documentation

## 2013-11-07 DIAGNOSIS — K5289 Other specified noninfective gastroenteritis and colitis: Secondary | ICD-10-CM | POA: Insufficient documentation

## 2013-11-07 LAB — CBC WITH DIFFERENTIAL/PLATELET
BASOS PCT: 1 % (ref 0–1)
Basophils Absolute: 0 10*3/uL (ref 0.0–0.1)
Eosinophils Absolute: 0.2 10*3/uL (ref 0.0–0.7)
Eosinophils Relative: 4 % (ref 0–5)
HCT: 39 % (ref 39.0–52.0)
HEMOGLOBIN: 14 g/dL (ref 13.0–17.0)
LYMPHS PCT: 45 % (ref 12–46)
Lymphs Abs: 2.3 10*3/uL (ref 0.7–4.0)
MCH: 28.2 pg (ref 26.0–34.0)
MCHC: 35.9 g/dL (ref 30.0–36.0)
MCV: 78.6 fL (ref 78.0–100.0)
MONOS PCT: 11 % (ref 3–12)
Monocytes Absolute: 0.6 10*3/uL (ref 0.1–1.0)
NEUTROS ABS: 2 10*3/uL (ref 1.7–7.7)
NEUTROS PCT: 39 % — AB (ref 43–77)
Platelets: 154 10*3/uL (ref 150–400)
RBC: 4.96 MIL/uL (ref 4.22–5.81)
RDW: 13.8 % (ref 11.5–15.5)
WBC: 5.1 10*3/uL (ref 4.0–10.5)

## 2013-11-07 LAB — COMPREHENSIVE METABOLIC PANEL
ALT: 32 U/L (ref 0–53)
AST: 28 U/L (ref 0–37)
Albumin: 4 g/dL (ref 3.5–5.2)
Alkaline Phosphatase: 71 U/L (ref 39–117)
BUN: 11 mg/dL (ref 6–23)
CO2: 29 meq/L (ref 19–32)
Calcium: 9.5 mg/dL (ref 8.4–10.5)
Chloride: 105 mEq/L (ref 96–112)
Creatinine, Ser: 0.79 mg/dL (ref 0.50–1.35)
GFR calc non Af Amer: >90 mL/min (ref >90)
Glucose, Bld: 84 mg/dL (ref 70–99)
POTASSIUM: 4.1 meq/L (ref 3.7–5.3)
Sodium: 142 mEq/L (ref 137–147)
TOTAL PROTEIN: 7.2 g/dL (ref 6.0–8.3)
Total Bilirubin: 0.2 mg/dL — ABNORMAL LOW (ref 0.3–1.2)

## 2013-11-07 MED ORDER — DICYCLOMINE HCL 20 MG PO TABS: ORAL_TABLET | ORAL | Status: DC

## 2013-11-07 NOTE — ED Provider Notes (Signed)
CSN: [id number]     Arrival date & time 11/07/13  1324 History   This chart was scribed for Benny LennertJoseph L Laureano Hetzer, MD by Tana ConchStephen Methvin, ED Scribe. This patient was seen in room APA06/APA06 and the patient's care was started at 1:45 PM .    Chief Complaint  Patient presents with  . Abdominal Pain      Patient is a 28 y.o. male presenting with abdominal pain and diarrhea. The history is provided by the patient. No language interpreter was used.  Abdominal Pain Pain location:  Periumbilical Pain radiates to:  Does not radiate Pain severity:  Mild Onset quality:  Sudden Duration:  8 hours Timing:  Intermittent Progression:  Partially resolved Chronicity:  New Relieved by:  Nothing Worsened by:  Nothing tried Ineffective treatments:  None tried Associated symptoms: diarrhea   Associated symptoms: no chest pain, no cough, no fatigue, no hematuria, no nausea and no vomiting   Diarrhea Quality:  Unable to specify Severity:  Moderate Onset quality:  Sudden Number of episodes:  7-8 Duration:  8 hours Timing:  Rare Progression:  Unchanged Relieved by:  Nothing Worsened by:  Nothing tried Ineffective treatments:  None tried Associated symptoms: no abdominal pain, no headaches and no vomiting     HPI Comments: Lee Meyer is a 28 y.o. male who presents to the Emergency Department complaining of sudden abdominal pain that began this morning, he has no h/o abdominal pain. He reports associated diarrhea and states he had 7 episodes since this morning. He denies nausea, black or bloody stools, and vomiting.    History reviewed. No pertinent past medical history. History reviewed. No pertinent past surgical history. History reviewed. No pertinent family history. History  Substance Use Topics  . Smoking status: Current Every Day Smoker -- 0.50 packs/day    Types: Cigarettes  . Smokeless tobacco: Not on file  . Alcohol Use: Yes     Comment: occ    Review of Systems  Constitutional:  Negative for appetite change and fatigue.  HENT: Negative for congestion, ear discharge and sinus pressure.   Eyes: Negative for discharge.  Respiratory: Negative for cough.   Cardiovascular: Negative for chest pain.  Gastrointestinal: Positive for diarrhea. Negative for nausea, vomiting and abdominal pain.  Genitourinary: Negative for frequency and hematuria.  Musculoskeletal: Negative for back pain.  Skin: Negative for rash.  Neurological: Negative for seizures and headaches.  Psychiatric/Behavioral: Negative for hallucinations.      Allergies  Review of patient's allergies indicates no known allergies.  Home Medications   Prior to Admission medications   Not on File   BP 147/80  Pulse 65  Temp(Src) 97.9 F (36.6 C) (Oral)  Resp 16  Ht 5\' 8"  (1.727 m)  Wt 148 lb (67.132 kg)  BMI 22.51 kg/m2  SpO2 100% Physical Exam  Nursing note and vitals reviewed. Constitutional: He is oriented to person, place, and time. He appears well-developed.  HENT:  Head: Normocephalic.  Eyes: Conjunctivae and EOM are normal. No scleral icterus.  Neck: Neck supple. No thyromegaly present.  Cardiovascular: Normal rate and regular rhythm.  Exam reveals no gallop and no friction rub.   No murmur heard. Pulmonary/Chest: No stridor. He has no wheezes. He has no rales. He exhibits no tenderness.  Abdominal: He exhibits no distension. There is no tenderness. There is no rebound.  Musculoskeletal: Normal range of motion. He exhibits no edema.  Lymphadenopathy:    He has no cervical adenopathy.  Neurological: He is oriented  to person, place, and time. He exhibits normal muscle tone. Coordination normal.  Skin: No rash noted. No erythema.  Psychiatric: He has a normal mood and affect. His behavior is normal.    ED Course  Procedures (including critical care time)   DIAGNOSTIC STUDIES: Oxygen Saturation is 100% on RA, normal by my interpretation.    COORDINATION OF CARE:   1:48 PM-Discussed  treatment plan which includes labs with pt at bedside and pt agreed to plan.   Labs Review Labs Reviewed - No data to display  Imaging Review No results found.   EKG Interpretation None      MDM   Final diagnoses:  None      I personally performed the services described in this documentation, which was scribed in my presence. The recorded information has been reviewed and is accurate.   The chart was scribed for me under my direct supervision.  I personally performed the history, physical, and medical decision making and all procedures in the evaluation of this patient.[name].    Yanina Knupp L Tylah Mancillas, MD 11/07/13 [phone]

## 2013-11-07 NOTE — Discharge Instructions (Signed)
Drink plenty of fluids,  Follow up next wee if needed

## 2013-11-07 NOTE — ED Notes (Signed)
Pt c/o ruq abd pain with diarrhea. Denies n/v. NAD at this time. Mm wet. denies black or bloody stools.

## 2014-02-24 ENCOUNTER — Encounter (HOSPITAL_COMMUNITY): Payer: Self-pay | Admitting: Emergency Medicine

## 2014-02-24 ENCOUNTER — Emergency Department (HOSPITAL_COMMUNITY)
Admission: EM | Admit: 2014-02-24 | Discharge: 2014-02-24 | Disposition: A | Discharge status: Home / Self Care | Payer: Self-pay | Attending: Emergency Medicine | Admitting: Emergency Medicine

## 2014-02-24 DIAGNOSIS — F17200 Nicotine dependence, unspecified, uncomplicated: Secondary | ICD-10-CM | POA: Insufficient documentation

## 2014-02-24 DIAGNOSIS — Z8719 Personal history of other diseases of the digestive system: Secondary | ICD-10-CM | POA: Insufficient documentation

## 2014-02-24 DIAGNOSIS — R109 Unspecified abdominal pain: Secondary | ICD-10-CM | POA: Insufficient documentation

## 2014-02-24 DIAGNOSIS — R197 Diarrhea, unspecified: Secondary | ICD-10-CM | POA: Insufficient documentation

## 2014-02-24 DIAGNOSIS — R112 Nausea with vomiting, unspecified: Secondary | ICD-10-CM | POA: Insufficient documentation

## 2014-02-24 MED ORDER — LOPERAMIDE HCL 2 MG PO CAPS
2.0000 mg | ORAL_CAPSULE | Freq: Once | ORAL | Status: AC
  Administered 2014-02-24: 2 mg via ORAL
  Filled 2014-02-24: qty 1

## 2014-02-24 MED ORDER — ONDANSETRON 4 MG PO TBDP
4.0000 mg | ORAL_TABLET | Freq: Once | ORAL | Status: AC
  Administered 2014-02-24: 4 mg via ORAL
  Filled 2014-02-24: qty 1

## 2014-02-24 MED ORDER — PROMETHAZINE HCL 25 MG PO TABS: 25.0000 mg | ORAL_TABLET | Freq: Four times a day (QID) | ORAL | Status: DC | PRN

## 2014-02-24 NOTE — Discharge Instructions (Signed)

## 2014-02-24 NOTE — ED Notes (Signed)
Pt c/o n/v/d abd pain that started last night, states that his nephew was sick with same symptoms.

## 2014-02-24 NOTE — ED Notes (Signed)
MD at bedside. 

## 2014-02-24 NOTE — ED Provider Notes (Signed)
TIME SEEN: 3:57 PM  CHIEF COMPLAINT: Nausea, vomiting and diarrhea  HPI: Patient is a 28 year old male with no significant past medical history no history of abdominal surgery who presents emergency department with 2 episodes of nonbloody, nonbilious vomiting last night and 4 episodes of nonbloody diarrhea. He states his nephew has had a viral gastroenteritis and he thinks that he caught it from him. He denies any fevers or chills. No abdominal pain. No dysuria or hematuria. No testicular pain or swelling. No penile discharge. No recent travel or antibiotic use.  ROS: See HPI Constitutional: no fever  Eyes: no drainage  ENT: no runny nose   Cardiovascular:  no chest pain  Resp: no SOB  GI:  vomiting GU: no dysuria Integumentary: no rash  Allergy: no hives  Musculoskeletal: no leg swelling  Neurological: no slurred speech ROS otherwise negative  PAST MEDICAL HISTORY/PAST SURGICAL HISTORY:  History reviewed. No pertinent past medical history.  MEDICATIONS:  Prior to Admission medications   Not on File    ALLERGIES:  No Known Allergies  SOCIAL HISTORY:  History  Substance Use Topics  . Smoking status: Current Every Day Smoker -- 0.50 packs/day    Types: Cigarettes  . Smokeless tobacco: Not on file  . Alcohol Use: Yes     Comment: occ    FAMILY HISTORY: No family history on file.  EXAM: BP 138/73  Pulse 68  Temp(Src) 99.2 F (37.3 C) (Oral)  Resp 16  Ht 5\' 8"  (1.727 m)  Wt 148 lb (67.132 kg)  BMI 22.51 kg/m2  SpO2 100% CONSTITUTIONAL: Alert and oriented and responds appropriately to questions. Well-appearing; well-nourished HEAD: Normocephalic EYES: Conjunctivae clear, PERRL ENT: normal nose; no rhinorrhea; moist mucous membranes; pharynx without lesions noted NECK: Supple, no meningismus, no LAD  CARD: RRR; S1 and S2 appreciated; no murmurs, no clicks, no rubs, no gallops RESP: Normal chest excursion without splinting or tachypnea; breath sounds clear and  equal bilaterally; no wheezes, no rhonchi, no rales,  ABD/GI: Normal bowel sounds; non-distended; soft, non-tender, no rebound, no guarding, no peritoneal signs BACK:  The back appears normal and is non-tender to palpation, there is no CVA tenderness EXT: Normal ROM in all joints; non-tender to palpation; no edema; normal capillary refill; no cyanosis    SKIN: Normal color for age and race; warm NEURO: Moves all extremities equally PSYCH: The patient's mood and manner are appropriate. Grooming and personal hygiene are appropriate.  MEDICAL DECISION MAKING: Patient here with viral gastritis. He is very well-appearing with a normal abdominal exam. He does not appear dehydrated on exam. I do not feel he needs labs, urine at this time as I feel they will not change his disposition. We'll give Zofran and Imodium. We'll by mouth challenge. I do not feel he needs abdominal imaging and has a very benign abdominal exam.  ED PROGRESS: Patient states he is feeling much better and is able to tolerate by mouth. We'll discharge him with return precautions and supportive care instructions and prescription for Phenergan. Verbalize understanding and is comfortable with plan.     Layla MawKristen N Jesly Hartmann, DO 02/24/14 [phone]

## 2014-03-21 ENCOUNTER — Encounter (HOSPITAL_COMMUNITY): Payer: Self-pay | Admitting: Emergency Medicine

## 2014-03-21 ENCOUNTER — Emergency Department (HOSPITAL_COMMUNITY)
Admission: EM | Admit: 2014-03-21 | Discharge: 2014-03-21 | Disposition: A | Discharge status: Home / Self Care | Payer: Self-pay | Attending: Emergency Medicine | Admitting: Emergency Medicine

## 2014-03-21 DIAGNOSIS — Z113 Encounter for screening for infections with a predominantly sexual mode of transmission: Secondary | ICD-10-CM | POA: Insufficient documentation

## 2014-03-21 DIAGNOSIS — F17200 Nicotine dependence, unspecified, uncomplicated: Secondary | ICD-10-CM | POA: Insufficient documentation

## 2014-03-21 LAB — HIV ANTIBODY (ROUTINE TESTING W REFLEX): HIV: NONREACTIVE

## 2014-03-21 LAB — RPR

## 2014-03-21 NOTE — ED Provider Notes (Signed)
CSN: [id number]     Arrival date & time 03/21/14  0809 History   First MD Initiated Contact with Patient 03/21/14 [phone]     Chief Complaint  Patient presents with  . SEXUALLY TRANSMITTED DISEASE     (Consider location/radiation/quality/duration/timing/severity/associated sxs/prior Treatment) HPI  Lee Meyer is a 28 y.o. male presenting for evaluation for possible STD.  He denies any symptoms including no penile or growing lesions, no penile discharge, burning with urination rash, fevers or chills.  His girl friend was diagnosed with a vaginal yeast infection 3 days ago and she prompted him to come in for testing.  He states he last had intercourse with her 5 days ago.     History reviewed. No pertinent past medical history. History reviewed. No pertinent past surgical history. History reviewed. No pertinent family history. History  Substance Use Topics  . Smoking status: Current Every Day Smoker -- 0.50 packs/day    Types: Cigarettes  . Smokeless tobacco: Not on file  . Alcohol Use: Yes     Comment: occ    Review of Systems  Constitutional: Negative for fever.  HENT: Negative for congestion and sore throat.   Eyes: Negative.   Respiratory: Negative for chest tightness and shortness of breath.   Cardiovascular: Negative for chest pain.  Gastrointestinal: Negative for nausea and abdominal pain.  Genitourinary: Negative.  Negative for dysuria, flank pain, discharge, difficulty urinating, genital sores and penile pain.  Musculoskeletal: Negative for arthralgias, joint swelling and neck pain.  Skin: Negative.  Negative for rash and wound.  Neurological: Negative for dizziness, weakness, light-headedness, numbness and headaches.  Psychiatric/Behavioral: Negative.       Allergies  Review of patient's allergies indicates no known allergies.  Home Medications   Prior to Admission medications   Medication Sig Start Date End Date Taking? Authorizing Provider  promethazine  (PHENERGAN) 25 MG tablet Take 1 tablet (25 mg total) by mouth every 6 (six) hours as needed for nausea or vomiting. 02/24/14   Kristen N Ward, DO   BP 116/76  Pulse 71  Temp(Src) 97.8 F (36.6 C) (Oral)  Resp 18  Ht  (1.727 m)  Wt 140 lb (63.504 kg)  BMI 21.29 kg/m2  SpO2 100% Physical Exam  Nursing note and vitals reviewed. Constitutional: He appears well-developed and well-nourished.  HENT:  Head: Normocephalic and atraumatic.  Eyes: Conjunctivae are normal.  Cardiovascular: Normal rate and regular rhythm.   Pulmonary/Chest: Effort normal and breath sounds normal.  Abdominal: Soft. He exhibits no distension.  Genitourinary: Penis normal. Circumcised. No discharge found.  Musculoskeletal: Normal range of motion.  Neurological: He is alert.  Skin: Skin is warm and dry.  Psychiatric: He has a normal mood and affect.    ED Course  Procedures (including critical care time) Labs Review Labs Reviewed  GC/CHLAMYDIA PROBE AMP  RPR  HIV ANTIBODY (ROUTINE TESTING)    Imaging Review No results found.   EKG Interpretation None      MDM   Final diagnoses:  Screening for STD (sexually transmitted disease)    Chaperone was present during exam.  Pt was screened for gc/chlamydia.  Rpr,hiv also drawn.  Pt was advised of symptoms suggesting yeast infection, reassured he did not have this infection.  Cultures pending, patient aware.   Prn f/u anticipated.    Burgess Amor, PA-C 03/21/14 1014

## 2014-03-21 NOTE — ED Notes (Signed)
Pt reports s/o was seen and diagnosed with a yeast infection on Wednesday. Pt reports "i want to be checked to make sure she didn't spread it to me." pt denies any gi/gu symptoms. nad noted.

## 2014-03-21 NOTE — Discharge Instructions (Signed)

## 2014-03-21 NOTE — ED Provider Notes (Signed)
Medical screening examination/treatment/procedure(s) were performed by non-physician practitioner and as supervising physician I was immediately available for consultation/collaboration.   EKG Interpretation None       Donnetta Hutching, MD 03/21/14 1504

## 2014-03-23 LAB — GC/CHLAMYDIA PROBE AMP
CT Probe RNA: NEGATIVE
GC PROBE AMP APTIMA: NEGATIVE

## 2014-10-21 DIAGNOSIS — Z720 Tobacco use: Secondary | ICD-10-CM | POA: Insufficient documentation

## 2014-10-21 DIAGNOSIS — K529 Noninfective gastroenteritis and colitis, unspecified: Secondary | ICD-10-CM | POA: Insufficient documentation

## 2014-10-22 ENCOUNTER — Emergency Department (HOSPITAL_COMMUNITY)
Admission: EM | Admit: 2014-10-22 | Discharge: 2014-10-22 | Disposition: A | Discharge status: Home / Self Care | Payer: Self-pay | Attending: Emergency Medicine | Admitting: Emergency Medicine

## 2014-10-22 ENCOUNTER — Encounter (HOSPITAL_COMMUNITY): Payer: Self-pay

## 2014-10-22 DIAGNOSIS — K529 Noninfective gastroenteritis and colitis, unspecified: Secondary | ICD-10-CM

## 2014-10-22 MED ORDER — IBUPROFEN 400 MG PO TABS
600.0000 mg | ORAL_TABLET | Freq: Once | ORAL | Status: AC
  Administered 2014-10-22: 600 mg via ORAL
  Filled 2014-10-22: qty 2

## 2014-10-22 MED ORDER — PROMETHAZINE HCL 12.5 MG PO TABS
25.0000 mg | ORAL_TABLET | Freq: Once | ORAL | Status: AC
  Administered 2014-10-22: 25 mg via ORAL
  Filled 2014-10-22: qty 2

## 2014-10-22 MED ORDER — PROMETHAZINE HCL 25 MG RE SUPP: 25.0000 mg | Freq: Four times a day (QID) | RECTAL | Status: DC | PRN

## 2014-10-22 NOTE — Discharge Instructions (Signed)

## 2014-10-22 NOTE — ED Notes (Signed)
Pt reports abd pain with vomiting and diarrhea since yesterday

## 2014-10-22 NOTE — ED Provider Notes (Signed)
CSN: [id number]     Arrival date & time 10/21/14  2336 History   First MD Initiated Contact with Patient 10/22/14 0129     Chief Complaint  Patient presents with  . Abdominal Pain     (Consider location/radiation/quality/duration/timing/severity/associated sxs/prior Treatment) HPI Comments: Pt comes in with cc of abd pain, emesis and diarrhea. Abd pain is periumbilical, and intermittent. Symptoms started 1 day ago. + nausea with emesis x 4 and loose BM x 6. No blood. There is no medical problems. No fevers.  Patient is a 29 y.o. male presenting with abdominal pain. The history is provided by the patient.  Abdominal Pain Associated symptoms: diarrhea, nausea and vomiting   Associated symptoms: no chest pain, no cough and no dysuria     History reviewed. No pertinent past medical history. History reviewed. No pertinent past surgical history. No family history on file. History  Substance Use Topics  . Smoking status: Current Every Day Smoker -- 0.50 packs/day    Types: Cigarettes  . Smokeless tobacco: Not on file  . Alcohol Use: Yes     Comment: occ    Review of Systems  Constitutional: Negative for activity change.  Respiratory: Negative for cough.   Cardiovascular: Negative for chest pain.  Gastrointestinal: Positive for nausea, vomiting, abdominal pain and diarrhea.  Genitourinary: Negative for dysuria.  Neurological: Negative for dizziness.      Allergies  Review of patient's allergies indicates no known allergies.  Home Medications   Prior to Admission medications   Medication Sig Start Date End Date Taking? Authorizing Provider  promethazine (PHENERGAN) 25 MG suppository Place 1 suppository (25 mg total) rectally every 6 (six) hours as needed for nausea. 10/22/14   Riot Barrick, MD   BP 138/71 mmHg  Pulse 65  Temp(Src) 98.3 F (36.8 C) (Oral)  Resp 18  Ht 5\' 8"  (1.727 m)  Wt 143 lb (64.864 kg)  BMI 21.75 kg/m2  SpO2 100% Physical Exam  Constitutional:  He is oriented to person, place, and time. He appears well-developed.  HENT:  Head: Normocephalic and atraumatic.  Eyes: Conjunctivae and EOM are normal. Pupils are equal, round, and reactive to light.  Neck: Normal range of motion. Neck supple.  Cardiovascular: Normal rate and regular rhythm.   Pulmonary/Chest: Effort normal and breath sounds normal.  Abdominal: Soft. Bowel sounds are normal. He exhibits no distension. There is tenderness. There is no rebound and no guarding.  Neurological: He is alert and oriented to person, place, and time.  Skin: Skin is warm.  Nursing note and vitals reviewed.   ED Course  Procedures (including critical care time)  ORAL CHALLENGE PASSED   Labs Review Labs Reviewed - No data to display  Imaging Review No results found.   EKG Interpretation None      MDM   Final diagnoses:  Gastroenteritis    Pt with geenralized, intermittent abd pain with emesis and diarrhea. No fevers. No signs of dehydration. + sick contact-  Nephew with similar sx. Suspect viral gastroenteritis.     Derwood KaplanAnkit Nani Ingram, MD 10/22/14 [phone]

## 2014-10-22 NOTE — ED Notes (Signed)
Patient passed  Fluid challenge successfully.

## 2015-03-29 ENCOUNTER — Emergency Department (HOSPITAL_COMMUNITY): Payer: Self-pay

## 2015-03-29 ENCOUNTER — Emergency Department (HOSPITAL_COMMUNITY)
Admission: EM | Admit: 2015-03-29 | Discharge: 2015-03-29 | Disposition: A | Discharge status: Home / Self Care | Payer: Self-pay | Attending: Emergency Medicine | Admitting: Emergency Medicine

## 2015-03-29 ENCOUNTER — Encounter (HOSPITAL_COMMUNITY): Payer: Self-pay

## 2015-03-29 DIAGNOSIS — R079 Chest pain, unspecified: Secondary | ICD-10-CM | POA: Insufficient documentation

## 2015-03-29 DIAGNOSIS — Z720 Tobacco use: Secondary | ICD-10-CM | POA: Insufficient documentation

## 2015-03-29 LAB — CBC
HEMATOCRIT: 40 % (ref 39.0–52.0)
HEMOGLOBIN: 14.3 g/dL (ref 13.0–17.0)
MCH: 28.7 pg (ref 26.0–34.0)
MCHC: 35.8 g/dL (ref 30.0–36.0)
MCV: 80.3 fL (ref 78.0–100.0)
Platelets: 145 10*3/uL — ABNORMAL LOW (ref 150–400)
RBC: 4.98 MIL/uL (ref 4.22–5.81)
RDW: 13.6 % (ref 11.5–15.5)
WBC: 5.9 10*3/uL (ref 4.0–10.5)

## 2015-03-29 LAB — BASIC METABOLIC PANEL
Anion gap: 5 (ref 5–15)
BUN: 14 mg/dL (ref 6–20)
CALCIUM: 8.6 mg/dL — AB (ref 8.9–10.3)
CO2: 28 mmol/L (ref 22–32)
CREATININE: 0.86 mg/dL (ref 0.61–1.24)
Chloride: 105 mmol/L (ref 101–111)
GFR calc Af Amer: >60 mL/min (ref >60)
Glucose, Bld: 97 mg/dL (ref 65–99)
Potassium: 3.8 mmol/L (ref 3.5–5.1)
SODIUM: 138 mmol/L (ref 135–145)

## 2015-03-29 LAB — TROPONIN I

## 2015-03-29 MED ORDER — SODIUM CHLORIDE 0.9 % IV SOLN
1000.0000 mL | INTRAVENOUS | Status: DC
  Administered 2015-03-29: 1000 mL via INTRAVENOUS

## 2015-03-29 MED ORDER — NAPROXEN 500 MG PO TABS: 500.0000 mg | ORAL_TABLET | Freq: Two times a day (BID) | ORAL | Status: DC

## 2015-03-29 NOTE — ED Provider Notes (Signed)
CSN: [id number]     Arrival date & time 03/29/15  4098 History   First MD Initiated Contact with Patient 03/29/15 [phone]     Chief Complaint  Patient presents with  . Chest Pain   Patient is a 29 y.o. male presenting with chest pain. The history is provided by the patient.  Chest Pain Pain location:  R chest Pain quality: sharp   Pain radiates to:  Does not radiate Pain severity:  Moderate Duration: started last wednesday night, constant. Progression:  Waxing and waning Context: breathing and movement   Relieved by:  Nothing Worsened by:  Nothing tried Ineffective treatments: bc powder abd ibuprofen. Associated symptoms: no abdominal pain, no cough, no fever, no nausea, no shortness of breath, not vomiting and no weakness   Risk factors: smoking   Risk factors: no coronary artery disease, no diabetes mellitus, no high cholesterol and no prior DVT/PE     History reviewed. No pertinent past medical history. History reviewed. No pertinent past surgical history. No family history on file. Social History  Substance Use Topics  . Smoking status: Current Every Day Smoker -- 0.50 packs/day    Types: Cigarettes  . Smokeless tobacco: None  . Alcohol Use: Yes     Comment: occ    Review of Systems  Constitutional: Negative for fever.  Respiratory: Negative for cough and shortness of breath.   Cardiovascular: Positive for chest pain.  Gastrointestinal: Negative for nausea, vomiting and abdominal pain.  Neurological: Negative for weakness.  All other systems reviewed and are negative.     Allergies  Review of patient's allergies indicates no known allergies.  Home Medications   Prior to Admission medications   Medication Sig Start Date End Date Taking? Authorizing Provider  naproxen (NAPROSYN) 500 MG tablet Take 1 tablet (500 mg total) by mouth 2 (two) times daily. 03/29/15   Linwood Dibbles, MD   BP 114/72 mmHg  Pulse 63  Temp(Src) 98.1 F (36.7 C) (Oral)  Resp 25  SpO2  100% Physical Exam  Constitutional: He appears well-developed and well-nourished. No distress.  HENT:  Head: Normocephalic and atraumatic.  Right Ear: External ear normal.  Left Ear: External ear normal.  Eyes: Conjunctivae are normal. Right eye exhibits no discharge. Left eye exhibits no discharge. No scleral icterus.  Neck: Neck supple. No tracheal deviation present.  Cardiovascular: Normal rate, regular rhythm and intact distal pulses.   Pulmonary/Chest: Effort normal and breath sounds normal. No stridor. No respiratory distress. He has no wheezes. He has no rales.  Abdominal: Soft. Bowel sounds are normal. He exhibits no distension. There is no tenderness. There is no rebound and no guarding.  Musculoskeletal: He exhibits no edema or tenderness.  Neurological: He is alert. He has normal strength. No cranial nerve deficit (no facial droop, extraocular movements intact, no slurred speech) or sensory deficit. He exhibits normal muscle tone. He displays no seizure activity. Coordination normal.  Skin: Skin is warm and dry. No rash noted.  Psychiatric: He has a normal mood and affect.  Nursing note and vitals reviewed.   ED Course  Procedures (including critical care time) Labs Review Labs Reviewed  CBC - Abnormal; Notable for the following:    Platelets 145 (*)    All other components within normal limits  BASIC METABOLIC PANEL - Abnormal; Notable for the following:    Calcium 8.6 (*)    All other components within normal limits  TROPONIN I    Imaging Review Dg Chest 2 View  03/29/2015   CLINICAL DATA:  Pt reports has had left sided chest pain since Wednesday off and on. Pt says pain is worse with movement. Denies sob or nausea  EXAM: CHEST  2 VIEW  COMPARISON:  08/05/2007.  FINDINGS: The heart size and mediastinal contours are within normal limits. Both lungs are clear. The visualized skeletal structures are unremarkable.  IMPRESSION: No active cardiopulmonary disease.    Electronically Signed   By: Charlett Nose M.D.   On: 03/29/2015 09:05   I have personally reviewed and evaluated these images and lab results as part of my medical decision-making.   EKG Interpretation   Date/Time:  Monday March 29 2015 08:29:13 EDT Ventricular Rate:  84 PR Interval:  131 QRS Duration: 89 QT Interval:  345 QTC Calculation: 408 R Axis:   39 Text Interpretation:  Sinus rhythm RSR' in V1 or V2, probably normal  variant LVH by voltage No significant change since last tracing Confirmed  by KNAPP  MD-J, JON ([id number]) on 03/29/2015 8:34:18 AM      MDM   Final diagnoses:  Chest pain, unspecified chest pain type    Patient is having pleuritic chest pain for the last week. Symptoms are atypical for cardiac disease. Patient is low risk. Cardiac enzymes and EKG are unremarkable.  Low risk for pulmonary embolism. Perc Negative.  At this time there does not appear to be any evidence of an acute emergency medical condition and the patient appears stable for discharge with appropriate outpatient follow up.     Linwood Dibbles, MD 03/29/15 [phone]

## 2015-03-29 NOTE — Discharge Instructions (Signed)

## 2015-03-29 NOTE — ED Notes (Signed)
Pt reports has had left sided chest pain since Wednesday off and on.  Pt says pain is worse with movement.  Denies sob or nausea.

## 2015-09-29 ENCOUNTER — Emergency Department (HOSPITAL_COMMUNITY)
Admission: EM | Admit: 2015-09-29 | Discharge: 2015-09-29 | Disposition: A | Discharge status: Home / Self Care | Payer: Self-pay | Attending: Emergency Medicine | Admitting: Emergency Medicine

## 2015-09-29 ENCOUNTER — Encounter (HOSPITAL_COMMUNITY): Payer: Self-pay | Admitting: Emergency Medicine

## 2015-09-29 DIAGNOSIS — F17210 Nicotine dependence, cigarettes, uncomplicated: Secondary | ICD-10-CM | POA: Insufficient documentation

## 2015-09-29 DIAGNOSIS — J069 Acute upper respiratory infection, unspecified: Secondary | ICD-10-CM | POA: Insufficient documentation

## 2015-09-29 DIAGNOSIS — Z79899 Other long term (current) drug therapy: Secondary | ICD-10-CM | POA: Insufficient documentation

## 2015-09-29 NOTE — ED Notes (Signed)
Patient has had nasal congestion for about 1 week, taking Tylenol cold and cough, without any relief.

## 2015-09-29 NOTE — Discharge Instructions (Signed)
Upper Respiratory Infection, Adult Most upper respiratory infections (URIs) are caused by a virus. A URI affects the nose, throat, and upper air passages. The most common type of URI is often called "the common cold." HOME CARE   Take medicines only as told by your doctor.  Gargle warm saltwater or take cough drops to comfort your throat as told by your doctor.  Use a warm mist humidifier or inhale steam from a shower to increase air moisture. This may make it easier to breathe.  Drink enough fluid to keep your pee (urine) clear or pale yellow.  Eat soups and other clear broths.  Have a healthy diet.  Rest as needed.  Go back to work when your fever is gone or your doctor says it is okay.  You may need to stay home longer to avoid giving your URI to others.  You can also wear a face mask and wash your hands often to prevent spread of the virus.  Use your inhaler more if you have asthma.  Do not use any tobacco products, including cigarettes, chewing tobacco, or electronic cigarettes. If you need help quitting, ask your doctor. GET HELP IF:  You are getting worse, not better.  Your symptoms are not helped by medicine.  You have chills.  You are getting more short of breath.  You have brown or red mucus.  You have yellow or brown discharge from your nose.  You have pain in your face, especially when you bend forward.  You have a fever.  You have puffy (swollen) neck glands.  You have pain while swallowing.  You have white areas in the back of your throat. GET HELP RIGHT AWAY IF:   You have very bad or constant:  Headache.  Ear pain.  Pain in your forehead, behind your eyes, and over your cheekbones (sinus pain).  Chest pain.  You have long-lasting (chronic) lung disease and any of the following:  Wheezing.  Long-lasting cough.  Coughing up blood.  A change in your usual mucus.  You have a stiff neck.  You have changes in  your:  Vision.  Hearing.  Thinking.  Mood. MAKE SURE YOU:   Understand these instructions.  Will watch your condition.  Will get help right away if you are not doing well or get worse.   This information is not intended to replace advice given to you by your health care provider. Make sure you discuss any questions you have with your health care provider.   Continue over-the-counter cold and cough medicine. Return for any new or worse symptoms.   Document Released: 12/13/2007 Document Revised: 11/10/2014 Document Reviewed: 10/01/2013 Elsevier Interactive Patient Education [employer]2016 [employer]

## 2015-09-29 NOTE — ED Provider Notes (Signed)
CSN: [id number]     Arrival date & time 09/29/15  [id number] History   First MD Initiated Contact with Patient 09/29/15 [phone]     Chief Complaint  Patient presents with  . Nasal Congestion     (Consider location/radiation/quality/duration/timing/severity/associated sxs/prior Treatment) The history is provided by the patient.   patient with one-week history of nasal congestion occasional cough but that has resolved. No fevers no nausea no vomiting no diarrhea. Patient's been taking over-the-counter cold and cough which is helping some but has not resolved.  History reviewed. No pertinent past medical history. History reviewed. No pertinent past surgical history. History reviewed. No pertinent family history. Social History  Substance Use Topics  . Smoking status: Current Every Day Smoker -- 0.50 packs/day    Types: Cigarettes  . Smokeless tobacco: None  . Alcohol Use: Yes     Comment: occ    Review of Systems  Constitutional: Negative for fever.  HENT: Positive for congestion. Negative for sore throat.   Eyes: Negative for visual disturbance.  Respiratory: Positive for cough. Negative for shortness of breath and wheezing.   Cardiovascular: Negative for chest pain.  Genitourinary: Negative for dysuria.  Musculoskeletal: Negative for back pain.  Skin: Negative for rash.  Neurological: Negative for headaches.  Hematological: Does not bruise/bleed easily.  Psychiatric/Behavioral: Negative for confusion.      Allergies  Review of patient's allergies indicates no known allergies.  Home Medications   Prior to Admission medications   Medication Sig Start Date End Date Taking? Authorizing Provider  naproxen (NAPROSYN) 500 MG tablet Take 1 tablet (500 mg total) by mouth 2 (two) times daily. 03/29/15   Linwood DibblesJon Knapp, MD   BP 121/81 mmHg  Pulse 74  Temp(Src) 98.4 F (36.9 C) (Oral)  Resp 18  Ht 5\' 8"  (1.727 m)  Wt 66.225 kg  BMI 22.20 kg/m2  SpO2 100% Physical Exam  Constitutional:  He is oriented to person, place, and time. He appears well-developed and well-nourished. No distress.  HENT:  Head: Normocephalic and atraumatic.  Mouth/Throat: Oropharynx is clear and moist. No oropharyngeal exudate.  Eyes: Conjunctivae and EOM are normal. Pupils are equal, round, and reactive to light.  Neck: Normal range of motion. Neck supple.  Cardiovascular: Normal rate, regular rhythm and normal heart sounds.   No murmur heard. Pulmonary/Chest: Effort normal and breath sounds normal. No respiratory distress.  Abdominal: Soft. Bowel sounds are normal. There is no tenderness.  Musculoskeletal: Normal range of motion.  Neurological: He is alert and oriented to person, place, and time. No cranial nerve deficit. He exhibits normal muscle tone. Coordination normal.  Skin: Skin is warm. No rash noted.  Nursing note and vitals reviewed.   ED Course  Procedures (including critical care time) Labs Review Labs Reviewed - No data to display  Imaging Review No results found. I have personally reviewed and evaluated these images and lab results as part of my medical decision-making.   EKG Interpretation None      MDM   Final diagnoses:  URI (upper respiratory infection)    Symptoms consistent with upper respiratory infection. Nontoxic no acute distress.   Vanetta MuldersScott Efton Thomley, MD 09/29/15 [phone]

## 2016-04-02 ENCOUNTER — Emergency Department (HOSPITAL_COMMUNITY)
Admission: EM | Admit: 2016-04-02 | Discharge: 2016-04-02 | Disposition: A | Discharge status: Home / Self Care | Payer: Self-pay | Attending: Emergency Medicine | Admitting: Emergency Medicine

## 2016-04-02 ENCOUNTER — Encounter (HOSPITAL_COMMUNITY): Payer: Self-pay | Admitting: Emergency Medicine

## 2016-04-02 DIAGNOSIS — F17210 Nicotine dependence, cigarettes, uncomplicated: Secondary | ICD-10-CM | POA: Insufficient documentation

## 2016-04-02 DIAGNOSIS — L02411 Cutaneous abscess of right axilla: Secondary | ICD-10-CM

## 2016-04-02 MED ORDER — LIDOCAINE-EPINEPHRINE (PF) 2 %-1:200000 IJ SOLN
10.0000 mL | Freq: Once | INTRAMUSCULAR | Status: AC
  Administered 2016-04-02: 10 mL via INTRADERMAL
  Filled 2016-04-02: qty 20

## 2016-04-02 MED ORDER — HYDROCODONE-ACETAMINOPHEN 5-325 MG PO TABS: ORAL_TABLET | ORAL | 0 refills | Status: DC

## 2016-04-02 MED ORDER — SULFAMETHOXAZOLE-TRIMETHOPRIM 800-160 MG PO TABS: 1.0000 | ORAL_TABLET | Freq: Two times a day (BID) | ORAL | 0 refills | Status: AC

## 2016-04-02 NOTE — ED Provider Notes (Signed)
[facility]-EMERGENCY DEPT Provider Note   CSN: [id number] Arrival date & time: 04/02/16  1059  By signing my name below, I, Lee Meyer, attest that this documentation has been prepared under the direction and in the presence of non-physician practitioner, Pauline Aus, PA-C. Electronically Signed: Nelwyn Meyer, Scribe. 04/02/2016. 11:38 AM.  History   Chief Complaint Chief Complaint  Patient presents with  . Abscess   The history is provided by the patient. No language interpreter was used.     HPI Comments:  Lee Meyer is a 30 y.o. male who presents to the Emergency Department complaining of sudden-onset worsening right-armpit abscess beginning two days ago. He notes that his pain elevates to a 9/10 if he lowers his right arm. Pt denies any associated fever or chills. When asked about changes that could have contributed to his symptoms, he reports that he has recently changed soaps.  He denies fever, chills, extremity pain or hx of MRSA  History reviewed. No pertinent past medical history.  There are no active problems to display for this patient.   History reviewed. No pertinent surgical history.     Home Medications    Prior to Admission medications   Medication Sig Start Date End Date Taking? Authorizing Provider  naproxen (NAPROSYN) 500 MG tablet Take 1 tablet (500 mg total) by mouth 2 (two) times daily. 03/29/15   Linwood Dibbles, MD    Family History History reviewed. No pertinent family history.  Social History Social History  Substance Use Topics  . Smoking status: Current Every Day Smoker    Packs/day: 0.50    Types: Cigarettes  . Smokeless tobacco: Never Used  . Alcohol use Yes     Comment: occ     Allergies   Review of patient's allergies indicates no known allergies.   Review of Systems Review of Systems  Constitutional: Negative for chills and fever.  Skin: Positive for wound (abscess).  All other systems reviewed and are negative.    Physical  Exam Updated Vital Signs BP 146/80 (BP Location: Right Arm)   Pulse 102   Temp 99.2 F (37.3 C) (Oral)   Resp 18   Ht 5\' 8"  (1.727 m)   Wt 132 lb 1 oz (59.9 kg)   SpO2 99%   BMI 20.08 kg/m   Physical Exam  Constitutional: He is oriented to person, place, and time. He appears well-developed and well-nourished. No distress.  HENT:  Head: Normocephalic and atraumatic.  Neck: Normal range of motion.  Cardiovascular: Normal rate and intact distal pulses.   Pulmonary/Chest: Effort normal. No respiratory distress.  Musculoskeletal: Normal range of motion.  Lymphadenopathy:    He has no cervical adenopathy.  Neurological: He is alert and oriented to person, place, and time.  Skin: Skin is warm and dry.  Localized area of induration to right axilla. No significant erythema or drainage.   Psychiatric: He has a normal mood and affect.  Nursing note and vitals reviewed.    ED Treatments / Results  DIAGNOSTIC STUDIES:  Oxygen Saturation is 99% on RA, normal by my interpretation.    COORDINATION OF CARE:  11:40 AM Discussed treatment plan with pt at bedside which included I&D and pt agreed to plan.    Labs (all labs ordered are listed, but only abnormal results are displayed) Labs Reviewed - No data to display  EKG  EKG Interpretation None       Radiology No results found.  Procedures Procedures (including critical care time)  INCISION AND DRAINAGE Performed by: Maxwell CaulRIPLETT,Hafsah Hendler L. Consent: Verbal consent obtained. Risks and benefits: risks, benefits and alternatives were discussed Type: abscess  Body area: right axilla  Anesthesia: local infiltration  Incision was made with a #11 scalpel.  Local anesthetic: lidocaine 2% w/ epinephrine  Anesthetic total: 3 ml  Complexity: complex Blunt dissection to break up loculations  Drainage: purulent  Drainage amount: large  Packing material: 1/4 in iodoform gauze  Patient tolerance: Patient tolerated the  procedure well with no immediate complications.    Medications Ordered in ED Medications - No data to display   Initial Impression / Assessment and Plan / ED Course  I have reviewed the triage vital signs and the nursing notes.  Pertinent labs & imaging results that were available during my care of the patient were reviewed by me and considered in my medical decision making (see chart for details).  Clinical Course     Patient with skin abscess. Incision and drainage performed in the ED today.   Wound recheck in 2 days. Supportive care and return precautions discussed. The patient appears reasonably screened and/or stabilized for discharge and I doubt any other emergent medical condition requiring further screening, evaluation, or treatment in the ED prior to discharge.   Final Clinical Impressions(s) / ED Diagnoses   Final diagnoses:  Abscess of axilla, right    New Prescriptions New Prescriptions   No medications on file    I personally performed the services described in this documentation, which was scribed in my presence. The recorded information has been reviewed and is accurate.     Pauline Ausammy Mckinley Olheiser, PA-C 04/06/16 1757    Donnetta HutchingBrian Cook, MD 04/10/16 [phone]

## 2016-04-02 NOTE — Discharge Instructions (Signed)
Apply warm wet compresses on/off.  Return here in 2 days for recheck and packing removal.

## 2016-04-02 NOTE — ED Triage Notes (Signed)
Pt with abscess to R axilla. Onset 2 days ago. Denies fever.

## 2016-10-29 ENCOUNTER — Emergency Department (HOSPITAL_COMMUNITY)
Admission: EM | Admit: 2016-10-29 | Discharge: 2016-10-29 | Disposition: A | Discharge status: Home / Self Care | Payer: Self-pay | Attending: Emergency Medicine | Admitting: Emergency Medicine

## 2016-10-29 ENCOUNTER — Encounter (HOSPITAL_COMMUNITY): Payer: Self-pay | Admitting: *Deleted

## 2016-10-29 DIAGNOSIS — K122 Cellulitis and abscess of mouth: Secondary | ICD-10-CM | POA: Insufficient documentation

## 2016-10-29 DIAGNOSIS — L0291 Cutaneous abscess, unspecified: Secondary | ICD-10-CM

## 2016-10-29 DIAGNOSIS — F17210 Nicotine dependence, cigarettes, uncomplicated: Secondary | ICD-10-CM | POA: Insufficient documentation

## 2016-10-29 MED ORDER — SULFAMETHOXAZOLE-TRIMETHOPRIM 800-160 MG PO TABS
1.0000 | ORAL_TABLET | Freq: Once | ORAL | Status: AC
  Administered 2016-10-29: 1 via ORAL
  Filled 2016-10-29: qty 1

## 2016-10-29 MED ORDER — SULFAMETHOXAZOLE-TRIMETHOPRIM 800-160 MG PO TABS: 1.0000 | ORAL_TABLET | Freq: Two times a day (BID) | ORAL | 0 refills | Status: AC

## 2016-10-29 MED ORDER — HYDROCODONE-ACETAMINOPHEN 5-325 MG PO TABS: 1.0000 | ORAL_TABLET | Freq: Four times a day (QID) | ORAL | 0 refills | Status: DC | PRN

## 2016-10-29 MED ORDER — LIDOCAINE HCL (PF) 2 % IJ SOLN
2.0000 mL | Freq: Once | INTRAMUSCULAR | Status: AC
  Administered 2016-10-29: 2 mL
  Filled 2016-10-29: qty 10

## 2016-10-29 NOTE — ED Provider Notes (Signed)
[facility]-EMERG[facility]Y DEPT Provider Note   CSN: [id number] Arrival date & time: 10/29/16  1759  By signing my name below, I, Cynda Acres, attest that this documentation has been prepared under the direction and in the presence of Burgess Amor, PA-C. Electronically Signed: Cynda Acres, Scribe. 10/29/16. 7:16 PM.  History   Chief Complaint Chief Complaint  Patient presents with  . Abscess    HPI Comments: Lee Meyer is a 31 y.o. male with no pertinent medical history, who presents to the Emergency Department complaining of a gradaul-onset "abscess" on the right jaw, underneath the right ear that appeared 3 days ago. Patient states the cause may be due to an ingrown hair. Patient reports recurrent abscesses to the same area. Patient reports associated pain and swelling. Patient reports applying warm compresses with no relief. Patient denies any dental pain, discharge, numbness, weakness, fever, or chills.   The history is provided by the patient. No language interpreter was used.    History reviewed. No pertinent past medical history.  There are no active problems to display for this patient.   History reviewed. No pertinent surgical history.     Home Medications    Prior to Admission medications   Medication Sig Start Date End Date Taking? Authorizing Provider  HYDROcodone-acetaminophen (NORCO/VICODIN) 5-325 MG tablet Take 1 tablet by mouth every 6 (six) hours as needed for moderate pain. 10/29/16   Burgess Amor, PA-C  naproxen (NAPROSYN) 500 MG tablet Take 1 tablet (500 mg total) by mouth 2 (two) times daily. 03/29/15   Linwood Dibbles, MD  sulfamethoxazole-trimethoprim (BACTRIM DS,SEPTRA DS) 800-160 MG tablet Take 1 tablet by mouth 2 (two) times daily. 10/29/16 11/05/16  Burgess Amor, PA-C    Family History History reviewed. No pertinent family history.  Social History Social History  Substance Use Topics  . Smoking status: Current Every Day Smoker    Packs/day: 0.50    Types:  Cigarettes  . Smokeless tobacco: Never Used  . Alcohol use No     Comment: occ     Allergies   Patient has no known allergies.   Review of Systems Review of Systems  Constitutional: Negative for chills and fever.  HENT: Negative for dental problem.   Skin:       Abscess to the right jaw with pain and swelling, no discharge.   Negative except as mentioned in HPI.   Neurological: Negative for weakness and numbness.     Physical Exam Updated Vital Signs BP 138/76 (BP Location: Right Arm)   Pulse 98   Temp 98.5 F (36.9 C) (Oral)   Resp 18   Ht  (1.727 m)   Wt 62.1 kg   SpO2 100%   BMI 20.83 kg/m   Physical Exam  Constitutional: He is oriented to person, place, and time. He appears well-developed.  HENT:  Head: Normocephalic and atraumatic.  Mouth/Throat: Oropharynx is clear and moist.  Raised 2 cm fluctuant mass at his right mandible angle. No drainage. No appearance surrounding erythema. Multiple old acne scarring to the bilateral cheeks. No sublingual induration. No trismus. Dentition appears healthy.   Cardiovascular: Normal rate.   Pulmonary/Chest: Effort normal.  Musculoskeletal: Normal range of motion.  Neurological: He is alert and oriented to person, place, and time.  Skin: Skin is warm and dry.  Psychiatric: He has a normal mood and affect.  Nursing note and vitals reviewed.    ED Treatments / Results  DIAGNOSTIC STUDIES: Oxygen Saturation is 100% on RA, normal by  my interpretation.    COORDINATION OF CARE: 7:16 PM Discussed treatment plan with pt at bedside and pt agreed to plan, which includes an I&D and antibiotics.   Labs (all labs ordered are listed, but only abnormal results are displayed) Labs Reviewed - No data to display  EKG  EKG Interpretation None       Radiology No results found.  Procedures Procedures (including critical care time)  INCISION AND DRAINAGE Performed by: Burgess Amor Consent: Verbal consent  obtained. Risks and benefits: risks, benefits and alternatives were discussed Type: abscess  Body area: right jawline  Anesthesia: local infiltration  Incision was made with a scalpel.  Local anesthetic: lidocaine 2% without epinephrine  Anesthetic total: 2 ml  Complexity: complex Blunt dissection to break up loculations  Drainage: purulent  Drainage amount: moderate  Packing material: none  Patient tolerance: Patient tolerated the procedure well with no immediate complications.     Medications Ordered in ED Medications  lidocaine (XYLOCAINE) 2 % injection 2 mL (2 mLs Other Given 10/29/16 1920)  sulfamethoxazole-trimethoprim (BACTRIM DS,SEPTRA DS) 800-160 MG per tablet 1 tablet (1 tablet Oral Given 10/29/16 2022)     Initial Impression / Assessment and Plan / ED Course  I have reviewed the triage vital signs and the nursing notes.  Pertinent labs & imaging results that were available during my care of the patient were reviewed by me and considered in my medical decision making (see chart for details).     Post I & D instructions given.  Bactrim, hydrocodone, continued warm soaks. Prn f/u anticipated.  Final Clinical Impressions(s) / ED Diagnoses   Final diagnoses:  Abscess    New Prescriptions New Prescriptions   HYDROCODONE-ACETAMINOPHEN (NORCO/VICODIN) 5-325 MG TABLET    Take 1 tablet by mouth every 6 (six) hours as needed for moderate pain.   SULFAMETHOXAZOLE-TRIMETHOPRIM (BACTRIM DS,SEPTRA DS) 800-160 MG TABLET    Take 1 tablet by mouth 2 (two) times daily.   I personally performed the services described in this documentation, which was scribed in my presence. The recorded information has been reviewed and is accurate.     Burgess Amor, PA-C 10/29/16 2033    Samuel Jester, DO 11/01/16 [phone]

## 2016-10-29 NOTE — ED Triage Notes (Signed)
Pt with abscess to right jaw below right ear for 3 days.

## 2016-10-29 NOTE — Discharge Instructions (Signed)
Take the entire course of the antibiotic prescribed.  You may take the hydrocodone prescribed for pain relief.  This will make you drowsy - do not drive within 4 hours of taking this medication.  

## 2017-01-31 ENCOUNTER — Encounter (HOSPITAL_COMMUNITY): Payer: Self-pay | Admitting: Emergency Medicine

## 2017-01-31 ENCOUNTER — Emergency Department (HOSPITAL_COMMUNITY)
Admission: EM | Admit: 2017-01-31 | Discharge: 2017-01-31 | Disposition: A | Discharge status: Home / Self Care | Payer: Self-pay | Attending: Emergency Medicine | Admitting: Emergency Medicine

## 2017-01-31 DIAGNOSIS — K6289 Other specified diseases of anus and rectum: Secondary | ICD-10-CM | POA: Insufficient documentation

## 2017-01-31 DIAGNOSIS — Z79899 Other long term (current) drug therapy: Secondary | ICD-10-CM | POA: Insufficient documentation

## 2017-01-31 DIAGNOSIS — F17210 Nicotine dependence, cigarettes, uncomplicated: Secondary | ICD-10-CM | POA: Insufficient documentation

## 2017-01-31 MED ORDER — HYDROCORTISONE 2.5 % RE CREA: TOPICAL_CREAM | RECTAL | 0 refills | Status: DC

## 2017-01-31 NOTE — ED Provider Notes (Signed)
[facility] Provider Note   CSN: [id number] Arrival date & time: 01/31/17  [id number]     History   Chief Complaint Chief Complaint  Patient presents with  . Hemorrhoids    HPI Lee Meyer is a 31 y.o. male.  HPI   Lee Meyer is a 31 y.o. male who presents to the Emergency Department complaining of hemorrhoids intermittently for 2 months.  He states that he notices swelling and pain to his rectal area after defecation.  States he will have an area of swelling that will bleed (bright red blood) very briefly and the swelling will resolve usually within 2 hours after BM.  Denies pain except when hemorrhoid is exposed.  He admits to frequent straining and sitting on the toilet for extended amounts of time.  He denies fever, chills, abdominal pain, skin changes.     History reviewed. No pertinent past medical history.  There are no active problems to display for this patient.   History reviewed. No pertinent surgical history.     Home Medications    Prior to Admission medications   Medication Sig Start Date End Date Taking? Authorizing Provider  HYDROcodone-acetaminophen (NORCO/VICODIN) 5-325 MG tablet Take 1 tablet by mouth every 6 (six) hours as needed for moderate pain. 10/29/16   Burgess AmorIdol, Julie, PA-C  naproxen (NAPROSYN) 500 MG tablet Take 1 tablet (500 mg total) by mouth 2 (two) times daily. 03/29/15   Linwood DibblesKnapp, Jon, MD    Family History History reviewed. No pertinent family history.  Social History Social History  Substance Use Topics  . Smoking status: Current Every Day Smoker    Packs/day: 0.50    Types: Cigarettes  . Smokeless tobacco: Never Used  . Alcohol use No     Comment: occ     Allergies   Patient has no known allergies.   Review of Systems Review of Systems  Constitutional: Negative for chills and fever.  Cardiovascular: Negative for chest pain.  Gastrointestinal: Positive for constipation. Negative for abdominal distention, abdominal pain,  diarrhea, nausea and vomiting.       Hemorrhoids  Genitourinary: Negative for dysuria.  Musculoskeletal: Negative for back pain.  Skin: Negative for color change and rash.  Psychiatric/Behavioral: Negative for confusion.     Physical Exam Updated Vital Signs BP (!) 145/88 (BP Location: Left Arm)   Pulse (!) 105   Temp 98.2 F (36.8 C) (Oral)   Resp 16   Ht 5\' 8"  (1.727 m)   Wt 62.1 kg (137 lb)   SpO2 99%   BMI 20.83 kg/m   Physical Exam  Constitutional: He is oriented to person, place, and time. He appears well-developed and well-nourished. No distress.  HENT:  Head: Atraumatic.  Mouth/Throat: Oropharynx is clear and moist.  Cardiovascular: Normal rate and regular rhythm.   No murmur heard. Pulmonary/Chest: Effort normal and breath sounds normal. No respiratory distress.  Abdominal: Soft. He exhibits no distension and no mass. There is no tenderness. There is no rebound and no guarding.  Genitourinary: Rectal exam shows tenderness. Rectal exam shows no external hemorrhoid, no internal hemorrhoid, no fissure, no mass, anal tone normal and guaiac negative stool.  Genitourinary Comments: Digital exam reveals brown, heme neg stool.  No rectal masses, no fissures.  Residual skin tags are present.   Musculoskeletal: Normal range of motion.  Neurological: He is alert and oriented to person, place, and time. No sensory deficit.  Skin: Skin is dry. Capillary refill takes less than 2 seconds.  Nursing note and vitals reviewed.    ED Treatments / Results  Labs (all labs ordered are listed, but only abnormal results are displayed) Labs Reviewed - No data to display  EKG  EKG Interpretation None       Radiology No results found.  Procedures Procedures (including critical care time)  Medications Ordered in ED Medications - No data to display   Initial Impression / Assessment and Plan / ED Course  I have reviewed the triage vital signs and the nursing  notes.  Pertinent labs & imaging results that were available during my care of the patient were reviewed by me and considered in my medical decision making (see chart for details).     Pt is asymptomatic at present.  No palpable or visualized rectal masses.  Vitals stable.  Abd soft, NT.  Advised pt to avoid straining and excessive sitting on the toilet.  Rx for anusol HC cream and referral info given for gen surgery.  Return precautions discussed.   Final Clinical Impressions(s) / ED Diagnoses   Final diagnoses:  Rectal or anal pain    New Prescriptions New Prescriptions   No medications on file     Rosey Bathriplett, Ramses Klecka, PA-C 01/31/17 0915    Bethann BerkshireZammit, Joseph, MD 01/31/17 [phone]

## 2017-01-31 NOTE — ED Triage Notes (Signed)
Pt reports swelling of rectum when having a bowel movement x 2 months.  No bleeding.

## 2017-01-31 NOTE — Discharge Instructions (Signed)
Avoid sitting on the commode for long periods of time.  Avoid straining to have a bowel movement.  Try to drink plenty of water and high fiber foods.  Call the surgeon listed to arrange a follow-up appt.

## 2017-09-07 ENCOUNTER — Encounter (HOSPITAL_COMMUNITY): Payer: Self-pay | Admitting: Emergency Medicine

## 2017-09-07 ENCOUNTER — Emergency Department (HOSPITAL_COMMUNITY)
Admission: EM | Admit: 2017-09-07 | Discharge: 2017-09-07 | Disposition: A | Discharge status: Home / Self Care | Payer: Self-pay | Attending: Emergency Medicine | Admitting: Emergency Medicine

## 2017-09-07 ENCOUNTER — Emergency Department (HOSPITAL_COMMUNITY): Payer: Self-pay

## 2017-09-07 DIAGNOSIS — S8001XA Contusion of right knee, initial encounter: Secondary | ICD-10-CM | POA: Insufficient documentation

## 2017-09-07 DIAGNOSIS — F17210 Nicotine dependence, cigarettes, uncomplicated: Secondary | ICD-10-CM | POA: Insufficient documentation

## 2017-09-07 DIAGNOSIS — Y990 Civilian activity done for income or pay: Secondary | ICD-10-CM | POA: Insufficient documentation

## 2017-09-07 DIAGNOSIS — Y9289 Other specified places as the place of occurrence of the external cause: Secondary | ICD-10-CM | POA: Insufficient documentation

## 2017-09-07 DIAGNOSIS — W010XXA Fall on same level from slipping, tripping and stumbling without subsequent striking against object, initial encounter: Secondary | ICD-10-CM | POA: Insufficient documentation

## 2017-09-07 DIAGNOSIS — Y9389 Activity, other specified: Secondary | ICD-10-CM | POA: Insufficient documentation

## 2017-09-07 NOTE — ED Provider Notes (Signed)
[facility] Provider [facility]   CSN: [id number] Arrival date & time: 09/07/17  1604     History   Chief Complaint Chief Complaint  Patient presents with  . Knee Injury    HPI Lee Meyer is a 32 y.o. male.  Patient is a [age] year old male who presents to the emergency department with a complaint of right knee pain.  Patient states that on last evening, February 28, he tripped over a pallet, and injured his right knee.  He states that his job wanted him to be evaluated.  Patient states that he is not having any significant pain right now.  He is able to ambulate, but he came to the emergency department as advised by his job.  No previous operations or procedures involving the right lower extremity.  Patient denies being on any anticoagulation medications, or any  bleeding disorders.      History reviewed. No pertinent past medical history.  There are no active problems to display for this patient.   History reviewed. No pertinent surgical history.     Home Medications    Prior to Admission medications   Medication Sig Start Date End Date Taking? Authorizing Provider  HYDROcodone-acetaminophen (NORCO/VICODIN) 5-325 MG tablet Take 1 tablet by mouth every 6 (six) hours as needed for moderate pain. 10/29/16   Idol, Raynelle FanningJulie, PA-C  hydrocortisone (ANUSOL-HC) 2.5 % rectal cream Apply rectally 2 times daily 01/31/17   Triplett, Tammy, PA-C  naproxen (NAPROSYN) 500 MG tablet Take 1 tablet (500 mg total) by mouth 2 (two) times daily. 03/29/15   Linwood DibblesKnapp, Jon, MD    Family History History reviewed. No pertinent family history.  Social History Social History   Tobacco Use  . Smoking status: Current Every Day Smoker    Packs/day: 0.50    Types: Cigarettes  . Smokeless tobacco: Never Used  Substance Use Topics  . Alcohol use: No    Comment: occ  . Drug use: No     Allergies   Patient has no known allergies.   Review of Systems Review of Systems    Constitutional: Negative for activity change.       All ROS Neg except as noted in HPI  HENT: Negative for nosebleeds.   Eyes: Negative for photophobia and discharge.  Respiratory: Negative for cough, shortness of breath and wheezing.   Cardiovascular: Negative for chest pain and palpitations.  Gastrointestinal: Negative for abdominal pain and blood in stool.  Genitourinary: Negative for dysuria, frequency and hematuria.  Musculoskeletal: Negative for arthralgias, back pain and neck pain.       Knee injury  Skin: Negative.   Neurological: Negative for dizziness, seizures and speech difficulty.  Psychiatric/Behavioral: Negative for confusion and hallucinations.     Physical Exam Updated Vital Signs BP 140/71 (BP Location: Right Arm)   Pulse 92   Temp 98.8 F (37.1 C) (Oral)   Resp 16   Ht 5\' 8"  (1.727 m)   Wt 62.1 kg (137 lb)   SpO2 100%   BMI 20.83 kg/m   Physical Exam  Constitutional: He appears well-developed and well-nourished. No distress.  HENT:  Head: Normocephalic and atraumatic.  Right Ear: External ear normal.  Left Ear: External ear normal.  Eyes: Conjunctivae are normal. Right eye exhibits no discharge. Left eye exhibits no discharge. No scleral icterus.  Neck: Neck supple. No tracheal deviation present.  Cardiovascular: Normal rate, regular rhythm and intact distal pulses.  Pulmonary/Chest: Effort normal and breath sounds normal. No stridor.  No respiratory distress. He has no wheezes. He has no rales.  Abdominal: Soft. Bowel sounds are normal. He exhibits no distension. There is no tenderness. There is no rebound and no guarding.  Musculoskeletal: He exhibits no edema or tenderness.       Right knee: He exhibits normal range of motion, no swelling, no effusion, no deformity, normal patellar mobility and no bony tenderness. No tenderness found.  Neurological: He is alert. He has normal strength. No cranial nerve deficit (no facial droop, extraocular movements  intact, no slurred speech) or sensory deficit. He exhibits normal muscle tone. He displays no seizure activity. Coordination normal.  Skin: Skin is warm and dry. No rash noted.  Psychiatric: He has a normal mood and affect.  Nursing note and vitals reviewed.    ED Treatments / Results  Labs (all labs ordered are listed, but only abnormal results are displayed) Labs Reviewed - No data to display  EKG  EKG Interpretation None       Radiology No results found.  Procedures Procedures (including critical care time)  Medications Ordered in ED Medications - No data to display   Initial Impression / Assessment and Plan / ED Course  I have reviewed the triage vital signs and the nursing notes.  Pertinent labs & imaging results that were available during my care of the patient were reviewed by me and considered in my medical decision making (see chart for details).       Final Clinical Impressions(s) / ED Diagnoses MDM  Vital signs reviewed.  Pulse oximetry 100% on room air.  Within normal limits by my interpretation.  Patient has good range of motion of the right knee.  No effusion appreciated.  No deformity noted.  No neurovascular deficits.  The x-ray of the knee is negative.  Patient advised to see Dr. Romeo Apple for orthopedic evaluation should the problem within the recur.  Patient is in agreement with this plan.   Final diagnoses:  Contusion of right knee, initial encounter    ED Discharge Orders    None       Ivery Quale, PA-C 09/07/17 1735    Margarita Grizzle, MD 09/09/17 1450

## 2017-09-07 NOTE — ED Triage Notes (Signed)
Pt reports tripping over a palate last night while moving with a friend.  Job wanted him checked to make sure he's ok.  Right knee pain.

## 2017-09-07 NOTE — Discharge Instructions (Signed)
Your x-ray is negative for fracture or dislocation.  Your examination is negative for effusion of the joint, dislocation, or other acute problem.  There are no neurologic or vascular deficits of your right lower extremity.  Please see the orthopedic specialist listed on your discharge instructions, if any changes, problems, or concerns with your knee.

## 2018-01-28 ENCOUNTER — Other Ambulatory Visit: Payer: Self-pay

## 2018-01-28 ENCOUNTER — Emergency Department (HOSPITAL_COMMUNITY)
Admission: EM | Admit: 2018-01-28 | Discharge: 2018-01-28 | Disposition: A | Discharge status: Home / Self Care | Payer: BLUE CROSS/BLUE SHIELD | Attending: Emergency Medicine | Admitting: Emergency Medicine

## 2018-01-28 ENCOUNTER — Encounter (HOSPITAL_COMMUNITY): Payer: Self-pay | Admitting: Emergency Medicine

## 2018-01-28 DIAGNOSIS — Y998 Other external cause status: Secondary | ICD-10-CM | POA: Insufficient documentation

## 2018-01-28 DIAGNOSIS — Y93E6 Activity, residential relocation: Secondary | ICD-10-CM | POA: Insufficient documentation

## 2018-01-28 DIAGNOSIS — X500XXA Overexertion from strenuous movement or load, initial encounter: Secondary | ICD-10-CM | POA: Diagnosis not present

## 2018-01-28 DIAGNOSIS — S3992XA Unspecified injury of lower back, initial encounter: Secondary | ICD-10-CM | POA: Diagnosis present

## 2018-01-28 DIAGNOSIS — S39012A Strain of muscle, fascia and tendon of lower back, initial encounter: Secondary | ICD-10-CM

## 2018-01-28 DIAGNOSIS — Z79899 Other long term (current) drug therapy: Secondary | ICD-10-CM | POA: Diagnosis not present

## 2018-01-28 DIAGNOSIS — Z87891 Personal history of nicotine dependence: Secondary | ICD-10-CM | POA: Insufficient documentation

## 2018-01-28 DIAGNOSIS — Y929 Unspecified place or not applicable: Secondary | ICD-10-CM | POA: Insufficient documentation

## 2018-01-28 MED ORDER — HYDROCODONE-ACETAMINOPHEN 5-325 MG PO TABS
1.0000 | ORAL_TABLET | Freq: Once | ORAL | Status: AC
  Administered 2018-01-28: 1 via ORAL
  Filled 2018-01-28: qty 1

## 2018-01-28 MED ORDER — IBUPROFEN 600 MG PO TABS: 600.0000 mg | ORAL_TABLET | Freq: Four times a day (QID) | ORAL | 0 refills | Status: DC | PRN

## 2018-01-28 MED ORDER — METHOCARBAMOL 500 MG PO TABS
750.0000 mg | ORAL_TABLET | Freq: Once | ORAL | Status: AC
  Administered 2018-01-28: 750 mg via ORAL
  Filled 2018-01-28: qty 2

## 2018-01-28 MED ORDER — HYDROCODONE-ACETAMINOPHEN 5-325 MG PO TABS: 1.0000 | ORAL_TABLET | ORAL | 0 refills | Status: DC | PRN

## 2018-01-28 MED ORDER — METHOCARBAMOL 750 MG PO TABS: 750.0000 mg | ORAL_TABLET | Freq: Four times a day (QID) | ORAL | 0 refills | Status: DC

## 2018-01-28 NOTE — ED Triage Notes (Signed)
Patient complaining of right sided back pain since moving furniture 2 days ago.

## 2018-01-28 NOTE — Discharge Instructions (Addendum)
Use the medicines as directed.  Do not drive within 4 hours of taking hydrocodone as this will make you drowsy.  Avoid lifting,  Bending,  Twisting or any other activity that worsens your pain over the next week.   You should get rechecked if your symptoms are not better over the next 5 days,  Or you develop increased pain,  Weakness in your leg(s) or loss of bladder or bowel function - these are symptoms of a worse injury.

## 2018-01-28 NOTE — ED Provider Notes (Signed)
[facility]   CSN: [id number] Arrival date & time: 01/28/18  2003     History   Chief Complaint Chief Complaint  Patient presents with  . Back Pain    HPI Lee Meyer is a 32 y.o. male with a history of occasional low back pain presenting with worse pain for the past 4 days since helping a friend move bedroom furniture 2 days ago.  Patient denies any new injury specifically and had no pain the day of the lifting, woke the next day with pain.  He has no radiation of pain into his legs and there has been no weakness or numbness in the lower extremities nor urinary or bowel retention or incontinence.  Patient does not have a history of cancer or IVDU.  He has had no treatment prior to arrival.  The history is provided by the patient.    History reviewed. No pertinent past medical history.  There are no active problems to display for this patient.   History reviewed. No pertinent surgical history.      Home Medications    Prior to Admission medications   Medication Sig Start Date End Date Taking? Authorizing Provider  HYDROcodone-acetaminophen (NORCO/VICODIN) 5-325 MG tablet Take 1 tablet by mouth every 4 (four) hours as needed. 01/28/18   Roston Grunewald, Raynelle FanningJulie, PA-C  hydrocortisone (ANUSOL-HC) 2.5 % rectal cream Apply rectally 2 times daily 01/31/17   Triplett, Tammy, PA-C  ibuprofen (ADVIL,MOTRIN) 600 MG tablet Take 1 tablet (600 mg total) by mouth every 6 (six) hours as needed. 01/28/18   Burgess AmorIdol, Tamirah George, PA-C  methocarbamol (ROBAXIN-750) 750 MG tablet Take 1 tablet (750 mg total) by mouth 4 (four) times daily. 01/28/18   Burgess AmorIdol, Rayven Hendrickson, PA-C  naproxen (NAPROSYN) 500 MG tablet Take 1 tablet (500 mg total) by mouth 2 (two) times daily. 03/29/15   Linwood DibblesKnapp, Jon, MD    Family History History reviewed. No pertinent family history.  Social History Social History   Tobacco Use  . Smoking status: Former Smoker    Packs/day: 0.50    Types: Cigarettes    Last  attempt to quit: 12/29/2017    Years since quitting: 0.0  . Smokeless tobacco: Never Used  Substance Use Topics  . Alcohol use: No    Comment: occ  . Drug use: Yes    Types: Marijuana     Allergies   Patient has no known allergies.   Review of Systems Review of Systems  Constitutional: Negative for fever.  Respiratory: Negative for shortness of breath.   Cardiovascular: Negative for chest pain and leg swelling.  Gastrointestinal: Negative for abdominal distention, abdominal pain and constipation.  Genitourinary: Negative for difficulty urinating, dysuria, flank pain, frequency and urgency.  Musculoskeletal: Positive for back pain. Negative for gait problem and joint swelling.  Skin: Negative for rash.  Neurological: Negative for weakness and numbness.     Physical Exam Updated Vital Signs BP (!) 157/79 (BP Location: Right Arm)   Pulse 82   Temp 97.8 F (36.6 C) (Oral)   Resp 18   Ht 5\' 8"  (1.727 m)   Wt 62.1 kg (137 lb)   SpO2 100%   BMI 20.83 kg/m   Physical Exam  Constitutional: He appears well-developed and well-nourished.  HENT:  Head: Normocephalic.  Eyes: Conjunctivae are normal.  Neck: Normal range of motion. Neck supple.  Cardiovascular: Normal rate and intact distal pulses.  Pedal pulses normal.  Pulmonary/Chest: Effort normal.  Abdominal: Soft. Bowel sounds are  normal. He exhibits no distension and no mass.  Musculoskeletal: Normal range of motion. He exhibits no edema.       Lumbar back: He exhibits tenderness. He exhibits no swelling, no edema and no spasm.  ttp right paralumbar region with appreciable muscle spasm.  Pt holding torso in rightward flexion. No midline pain.  Neurological: He is alert. He has normal strength. He displays no atrophy and no tremor. No sensory deficit. Gait normal.  Reflex Scores:      Patellar reflexes are 2+ on the right side and 2+ on the left side.      Achilles reflexes are 2+ on the right side and 2+ on the left  side. No strength deficit noted in hip and knee flexor and extensor muscle groups.  Ankle flexion and extension intact.  Skin: Skin is warm and dry.  Psychiatric: He has a normal mood and affect.  Nursing note and vitals reviewed.    ED Treatments / Results  Labs (all labs ordered are listed, but only abnormal results are displayed) Labs Reviewed - No data to display  EKG None  Radiology No results found.  Procedures Procedures (including critical care time)  Medications Ordered in ED Medications  HYDROcodone-acetaminophen (NORCO/VICODIN) 5-325 MG per tablet 1 tablet (1 tablet Oral Given 01/28/18 2218)  methocarbamol (ROBAXIN) tablet 750 mg (750 mg Oral Given 01/28/18 2218)     Initial Impression / Assessment and Plan / ED Course  I have reviewed the triage vital signs and the nursing notes.  Pertinent labs & imaging results that were available during my care of the patient were reviewed by me and considered in my medical decision making (see chart for details).     No neuro deficit on exam or by history to suggest emergent or surgical presentation.  Outlined worsened sx that should prompt immediate re-evaluation including distal weakness, bowel/bladder retention/incontinence.  No findings or hx suggesting cauda equina, no radicular pain.      Final Clinical Impressions(s) / ED Diagnoses   Final diagnoses:  Strain of lumbar region, initial encounter    ED Discharge Orders        Ordered    HYDROcodone-acetaminophen (NORCO/VICODIN) 5-325 MG tablet  Every 4 hours PRN     01/28/18 2153    methocarbamol (ROBAXIN-750) 750 MG tablet  4 times daily     01/28/18 2153    ibuprofen (ADVIL,MOTRIN) 600 MG tablet  Every 6 hours PRN     01/28/18 2153       Burgess Amor, PA-C 01/29/18 1324    Donnetta Hutching, MD 01/29/18 1558

## 2018-05-27 ENCOUNTER — Other Ambulatory Visit: Payer: Self-pay

## 2018-05-27 ENCOUNTER — Emergency Department (HOSPITAL_COMMUNITY)
Admission: EM | Admit: 2018-05-27 | Discharge: 2018-05-27 | Disposition: A | Discharge status: Home / Self Care | Payer: BLUE CROSS/BLUE SHIELD | Attending: Emergency Medicine | Admitting: Emergency Medicine

## 2018-05-27 ENCOUNTER — Encounter (HOSPITAL_COMMUNITY): Payer: Self-pay | Admitting: Emergency Medicine

## 2018-05-27 DIAGNOSIS — L0201 Cutaneous abscess of face: Secondary | ICD-10-CM | POA: Insufficient documentation

## 2018-05-27 DIAGNOSIS — Z79899 Other long term (current) drug therapy: Secondary | ICD-10-CM | POA: Insufficient documentation

## 2018-05-27 DIAGNOSIS — L0291 Cutaneous abscess, unspecified: Secondary | ICD-10-CM

## 2018-05-27 DIAGNOSIS — F17210 Nicotine dependence, cigarettes, uncomplicated: Secondary | ICD-10-CM | POA: Insufficient documentation

## 2018-05-27 MED ORDER — LIDOCAINE-EPINEPHRINE (PF) 2 %-1:200000 IJ SOLN: 10.0000 mL | Freq: Once | INTRAMUSCULAR | Status: DC

## 2018-05-27 MED ORDER — LIDOCAINE-EPINEPHRINE (PF) 1 %-1:200000 IJ SOLN
INTRAMUSCULAR | Status: AC
  Administered 2018-05-27: 09:00:00
  Filled 2018-05-27: qty 30

## 2018-05-27 MED ORDER — SULFAMETHOXAZOLE-TRIMETHOPRIM 800-160 MG PO TABS: 1.0000 | ORAL_TABLET | Freq: Two times a day (BID) | ORAL | 0 refills | Status: AC

## 2018-05-27 NOTE — Discharge Instructions (Addendum)
Complete your entire course of the antibiotics are gone and continue using warm water soaks 3 times daily with gentle massage around the site as discussed to encourage continue drainage of any infection that might try to reaccumulate.  Get rechecked for any worsened symptoms.

## 2018-05-27 NOTE — ED Triage Notes (Signed)
Pt c/o abscess from shaving on RT side of face x 3 days. Has used warm compresses with relief. Denies fever or drainage from abscess.

## 2018-05-27 NOTE — ED Provider Notes (Signed)
[facility]ERGENCY DEPARTMENT Provider Note   CSN: [id number] Arrival date & time: 05/27/18  [id number]     History   Chief Complaint Chief Complaint  Patient presents with  . Abscess    HPI Lee Meyer is a 32 y.o. male.  The history is provided by the patient.  Abscess  Location:  Face Facial abscess location:  Face Abscess quality: induration and painful   Red streaking: no   Duration:  3 days Pain details:    Quality:  Sharp and throbbing   Severity:  Severe   Duration:  3 days   Progression:  Waxing and waning (gets larger at night, smaller after application of warm compresses) Chronicity:  Recurrent (has had abscesses within his beard previous) Context: not diabetes, not injected drug use, not insect bite/sting and not skin injury   Relieved by:  Warm compresses Worsened by:  Nothing Associated symptoms: no fever, no nausea and no vomiting   Risk factors: prior abscess   Risk factors: no hx of MRSA     History reviewed. No pertinent past medical history.  There are no active problems to display for this patient.   History reviewed. No pertinent surgical history.      Home Medications    Prior to Admission medications   Medication Sig Start Date End Date Taking? Authorizing Provider  HYDROcodone-acetaminophen (NORCO/VICODIN) 5-325 MG tablet Take 1 tablet by mouth every 4 (four) hours as needed. 01/28/18   Rakim Moone, Raynelle FanningJulie, PA-C  hydrocortisone (ANUSOL-HC) 2.5 % rectal cream Apply rectally 2 times daily 01/31/17   Triplett, Tammy, PA-C  ibuprofen (ADVIL,MOTRIN) 600 MG tablet Take 1 tablet (600 mg total) by mouth every 6 (six) hours as needed. 01/28/18   Burgess AmorIdol, Jionni Helming, PA-C  methocarbamol (ROBAXIN-750) 750 MG tablet Take 1 tablet (750 mg total) by mouth 4 (four) times daily. 01/28/18   Burgess AmorIdol, Gerturde Kuba, PA-C  naproxen (NAPROSYN) 500 MG tablet Take 1 tablet (500 mg total) by mouth 2 (two) times daily. 03/29/15   Linwood DibblesKnapp, Jon, MD  sulfamethoxazole-trimethoprim (BACTRIM  DS,SEPTRA DS) 800-160 MG tablet Take 1 tablet by mouth 2 (two) times daily for 10 days. 05/27/18 06/06/18  Burgess AmorIdol, Shigeko Manard, PA-C    Family History No family history on file.  Social History Social History   Tobacco Use  . Smoking status: Current Every Day Smoker    Packs/day: 0.50    Types: Cigarettes  . Smokeless tobacco: Never Used  Substance Use Topics  . Alcohol use: No    Comment: occ  . Drug use: Yes    Types: Marijuana     Allergies   Patient has no known allergies.   Review of Systems Review of Systems  Constitutional: Negative for chills and fever.  Respiratory: Negative for shortness of breath and wheezing.   Gastrointestinal: Negative for nausea and vomiting.  Skin: Positive for wound.  Neurological: Negative for numbness.     Physical Exam Updated Vital Signs BP 136/81 (BP Location: Left Arm)   Pulse 67   Temp 97.8 F (36.6 C) (Oral)   Resp 14   Ht 5\' 8"  (1.727 m)   Wt 66.7 kg   SpO2 100%   BMI 22.35 kg/m   Physical Exam  Constitutional: He is oriented to person, place, and time. He appears well-developed and well-nourished.  HENT:  Head: Normocephalic.  Cardiovascular: Normal rate.  Pulmonary/Chest: Effort normal.  Neurological: He is alert and oriented to person, place, and time. No sensory deficit.  Skin:  3 cm  raised, indurated and tender abscess along the right mandible. No drainage or tenting.     ED Treatments / Results  Labs (all labs ordered are listed, but only abnormal results are displayed) Labs Reviewed - No data to display  EKG None  Radiology No results found.   Informal bedside US performed to confirm fluid pocket and no near vasculature.  There are a few small vessels noted deep to the abscess pocket but not invading the space.  Procedures Procedures (including critical care time)  INCISION AND DRAINAGE Performed by: Burgess Amor Consent: Verbal consent obtained. Risks and benefits: risks, benefits and alternatives  were discussed Type: abscess  Body area: right jaw  Anesthesia: local infiltration  Incision was made with a scalpel.  Local anesthetic: lidocaine 1% with epinephrine  Anesthetic total: 2 ml  Complexity: complex Blunt dissection to break up loculations  Drainage: purulent  Drainage amount: moderate  Packing material: no packing  Patient tolerance: Patient tolerated the procedure well with no immediate complications.     Medications Ordered in ED Medications  lidocaine-EPINEPHrine (XYLOCAINE W/EPI) 2 %-1:200000 (PF) injection 10 mL (0 mLs Other Hold 05/27/18 0843)  lidocaine-EPINEPHrine (XYLOCAINE-EPINEPHrine) 1 %-1:200000 (PF) injection (  Given by Other 05/27/18 1610)     Initial Impression / Assessment and Plan / ED Course  I have reviewed the triage vital signs and the nursing notes.  Pertinent labs & imaging results that were available during my care of the patient were reviewed by me and considered in my medical decision making (see chart for details).     Pt home instructions given, continued warm soaks, abx started.  Final Clinical Impressions(s) / ED Diagnoses   Final diagnoses:  Abscess    ED Discharge Orders         Ordered    sulfamethoxazole-trimethoprim (BACTRIM DS,SEPTRA DS) 800-160 MG tablet  2 times daily     05/27/18 0918           Burgess Amor, PA-C 05/27/18 0920    Bethann Berkshire, MD 05/28/18 [phone]

## 2018-12-02 ENCOUNTER — Emergency Department (HOSPITAL_COMMUNITY)
Admission: EM | Admit: 2018-12-02 | Discharge: 2018-12-02 | Disposition: A | Discharge status: Home / Self Care | Payer: Self-pay | Attending: Emergency Medicine | Admitting: Emergency Medicine

## 2018-12-02 ENCOUNTER — Encounter (HOSPITAL_COMMUNITY): Payer: Self-pay | Admitting: *Deleted

## 2018-12-02 ENCOUNTER — Other Ambulatory Visit: Payer: Self-pay

## 2018-12-02 DIAGNOSIS — Z79899 Other long term (current) drug therapy: Secondary | ICD-10-CM | POA: Insufficient documentation

## 2018-12-02 DIAGNOSIS — H1031 Unspecified acute conjunctivitis, right eye: Secondary | ICD-10-CM | POA: Insufficient documentation

## 2018-12-02 DIAGNOSIS — F17210 Nicotine dependence, cigarettes, uncomplicated: Secondary | ICD-10-CM | POA: Insufficient documentation

## 2018-12-02 NOTE — ED Provider Notes (Signed)
[facility] EMERGENCY DEPARTMENT Provider Note   CSN: [id number] Arrival date & time: 12/02/18  1426    History   Chief Complaint Chief Complaint  Patient presents with  . Eye Problem    HPI Lee Meyer is a 33 y.o. male.     The history is provided by the patient. No language interpreter was used.  Eye Problem  Location:  Right eye Quality:  Aching Severity:  Mild Onset quality:  Sudden Timing:  Constant Progression:  Worsening Chronicity:  New Relieved by:  Nothing Worsened by:  Nothing Ineffective treatments:  None tried Risk factors: no recent URI   Pt reports his eye was irritated and he used visine.  Pt reports he was told by his job he has to have a note to return.  He currently has no symptoms   History reviewed. No pertinent past medical history.  There are no active problems to display for this patient.   History reviewed. No pertinent surgical history.      Home Medications    Prior to Admission medications   Medication Sig Start Date End Date Taking? Authorizing Provider  HYDROcodone-acetaminophen (NORCO/VICODIN) 5-325 MG tablet Take 1 tablet by mouth every 4 (four) hours as needed. 01/28/18   Idol, Raynelle Fanning, PA-C  hydrocortisone (ANUSOL-HC) 2.5 % rectal cream Apply rectally 2 times daily 01/31/17   Triplett, Tammy, PA-C  ibuprofen (ADVIL,MOTRIN) 600 MG tablet Take 1 tablet (600 mg total) by mouth every 6 (six) hours as needed. 01/28/18   Burgess Amor, PA-C  methocarbamol (ROBAXIN-750) 750 MG tablet Take 1 tablet (750 mg total) by mouth 4 (four) times daily. 01/28/18   Burgess Amor, PA-C  naproxen (NAPROSYN) 500 MG tablet Take 1 tablet (500 mg total) by mouth 2 (two) times daily. 03/29/15   Linwood Dibbles, MD    Family History History reviewed. No pertinent family history.  Social History Social History   Tobacco Use  . Smoking status: Current Every Day Smoker    Packs/day: 0.50    Types: Cigarettes  . Smokeless tobacco: Never Used  Substance Use  Topics  . Alcohol use: No    Comment: occ  . Drug use: Yes    Types: Marijuana     Allergies   Patient has no known allergies.   Review of Systems Review of Systems  All other systems reviewed and are negative.    Physical Exam Updated Vital Signs BP (!) 159/78 (BP Location: Right Arm)   Pulse 96   Temp 98.4 F (36.9 C) (Oral)   Resp 16   Ht 5\' 8"  (1.727 m)   Wt 60.8 kg   SpO2 100%   BMI 20.37 kg/m   Physical Exam Vitals signs and nursing note reviewed.  Constitutional:      Appearance: He is well-developed.  HENT:     Head: Normocephalic and atraumatic.     Right Ear: Tympanic membrane normal.     Left Ear: Tympanic membrane normal.     Nose: Nose normal.     Mouth/Throat:     Mouth: Mucous membranes are moist.  Eyes:     Conjunctiva/sclera: Conjunctivae normal.  Neck:     Musculoskeletal: Normal range of motion and neck supple.  Cardiovascular:     Rate and Rhythm: Normal rate and regular rhythm.     Heart sounds: No murmur.  Pulmonary:     Effort: Pulmonary effort is normal. No respiratory distress.     Breath sounds: Normal breath sounds.  Abdominal:  Palpations: Abdomen is soft.     Tenderness: There is no abdominal tenderness.  Skin:    General: Skin is warm and dry.  Neurological:     General: No focal deficit present.     Mental Status: He is alert.  Psychiatric:        Mood and Affect: Mood normal.      ED Treatments / Results  Labs (all labs ordered are listed, but only abnormal results are displayed) Labs Reviewed - No data to display  EKG None  Radiology No results found.  Procedures Procedures (including critical care time)  Medications Ordered in ED Medications - No data to display   Initial Impression / Assessment and Plan / ED Course  I have reviewed the triage vital signs and the nursing notes.  Pertinent labs & imaging results that were available during my care of the patient were reviewed by me and considered  in my medical decision making (see chart for details).          Final Clinical Impressions(s) / ED Diagnoses   Final diagnoses:  Acute conjunctivitis of right eye, unspecified acute conjunctivitis type    ED Discharge Orders    None     An After Visit Summary was printed and given to the patient.    Elson AreasSofia, Kamsiyochukwu Spickler K, PA-C 12/02/18 1534    Geoffery Lyonselo, Douglas, MD 12/04/18 [phone]

## 2018-12-02 NOTE — Discharge Instructions (Addendum)
Return if any problems.

## 2018-12-02 NOTE — ED Notes (Signed)
Patient states he is asymptomatic at this time. States his eye was red Saturday but he used Visine and it's not longer red. Denies pain or vision loss.

## 2018-12-02 NOTE — ED Triage Notes (Signed)
Pt with right eye redness on Saturday evening, pt used OTC Visine eye drops and it had cleared by Sunday afternoon. Pt states here for a work note to be able to return to work.

## 2019-07-30 DIAGNOSIS — J45909 Unspecified asthma, uncomplicated: Secondary | ICD-10-CM | POA: Insufficient documentation

## 2019-09-08 ENCOUNTER — Other Ambulatory Visit: Payer: Self-pay

## 2019-09-08 ENCOUNTER — Encounter (HOSPITAL_COMMUNITY): Payer: Self-pay | Admitting: *Deleted

## 2019-09-08 ENCOUNTER — Emergency Department (HOSPITAL_COMMUNITY)
Admission: EM | Admit: 2019-09-08 | Discharge: 2019-09-08 | Disposition: A | Discharge status: Home / Self Care | Payer: Self-pay | Attending: Emergency Medicine | Admitting: Emergency Medicine

## 2019-09-08 DIAGNOSIS — J069 Acute upper respiratory infection, unspecified: Secondary | ICD-10-CM | POA: Insufficient documentation

## 2019-09-08 DIAGNOSIS — F17210 Nicotine dependence, cigarettes, uncomplicated: Secondary | ICD-10-CM | POA: Insufficient documentation

## 2019-09-08 DIAGNOSIS — Z20822 Contact with and (suspected) exposure to covid-19: Secondary | ICD-10-CM | POA: Insufficient documentation

## 2019-09-08 MED ORDER — BENZONATATE 100 MG PO CAPS: 200.0000 mg | ORAL_CAPSULE | Freq: Two times a day (BID) | ORAL | 0 refills | Status: DC | PRN

## 2019-09-08 MED ORDER — FLUTICASONE PROPIONATE 50 MCG/ACT NA SUSP: 2.0000 | Freq: Every day | NASAL | 2 refills | Status: DC

## 2019-09-08 NOTE — ED Triage Notes (Signed)
Pt with runny nose and cough for past few days, denies -sore throar, HA, N/V or loss of taste or smell

## 2019-09-08 NOTE — Discharge Instructions (Signed)
We have tested you for coronavirus, this test will come back within the next 24 hours or so.  If it is positive you will be called to let you know.  You can also follow your results on MyChart.  This appears that you have a virus or a common cold.  You should take Flonase nasal spray, 2 puffs in each nostril every morning for the next couple of weeks  You may benefit from Sudafed, Zyrtec or other over-the-counter medications to help with the cold.  I have also prescribed a cough medicine called Tessalon which can be used every 8 hours as needed for coughing  Please seek medical exam for severe or worsening symptoms  If your coronavirus test is negative you can go back to work as soon as you are improved with your coughing and runny nose, if your coronavirus test is positive you will need to be out of work for a minimum of 10 days from the first day of symptoms as long as you are doing better.

## 2019-09-08 NOTE — ED Provider Notes (Signed)
[facility] EMERGENCY DEPARTMENT Provider Note   CSN: [id number] Arrival date & time: 09/08/19  2150     History Chief Complaint  Patient presents with  . Nasal Congestion    Lee Meyer is a 34 y.o. male.  HPI   This patient is a 34 year old male who presents with "a common cold".  He states about 3 days ago he started with a runny nose and a cough, he denies headache fevers chills nausea vomiting diarrhea or myalgias.  He denies being around anybody that has been sick and has not lost his sense of taste or smell.  No medications given prior to arrival.  Septums are persistent, mild to moderate, nothing seems to make it better or worse  History reviewed. No pertinent past medical history.  There are no problems to display for this patient.   History reviewed. No pertinent surgical history.     History reviewed. No pertinent family history.  Social History   Tobacco Use  . Smoking status: Current Every Day Smoker    Packs/day: 0.50    Types: Cigarettes  . Smokeless tobacco: Never Used  Substance Use Topics  . Alcohol use: No    Comment: occ  . Drug use: Not Currently    Types: Marijuana    Home Medications Prior to Admission medications   Medication Sig Start Date End Date Taking? Authorizing Provider  benzonatate (TESSALON) 100 MG capsule Take 2 capsules (200 mg total) by mouth 2 (two) times daily as needed for cough. 09/08/19   Noemi Chapel, MD  fluticasone (FLONASE) 50 MCG/ACT nasal spray Place 2 sprays into both nostrils daily. 09/08/19   Noemi Chapel, MD    Allergies    Patient has no known allergies.  Review of Systems   Review of Systems  Constitutional: Negative for chills and fever.  HENT: Positive for rhinorrhea. Negative for sore throat.   Respiratory: Positive for cough. Negative for shortness of breath.   Neurological: Negative for weakness, numbness and headaches.    Physical Exam Updated Vital Signs BP (!) 158/91 (BP Location: Right Arm)    Pulse 91   Temp 98.4 F (36.9 C) (Oral)   Resp 16   Ht 1.727 m (5\' 8" )   Wt 62.1 kg   SpO2 100%   BMI 20.83 kg/m   Physical Exam Vitals and nursing note reviewed.  Constitutional:      Appearance: He is well-developed. He is not diaphoretic.  HENT:     Head: Normocephalic and atraumatic.     Nose: Rhinorrhea present.     Mouth/Throat:     Comments: Clear oropharynx, posterior pharynx without erythema exudate asymmetry or hypertrophy, moist mucous membranes Eyes:     General:        Right eye: No discharge.        Left eye: No discharge.     Conjunctiva/sclera: Conjunctivae normal.  Neck:     Comments: No lymphadenopathy of the neck Cardiovascular:     Rate and Rhythm: Normal rate.  Pulmonary:     Effort: Pulmonary effort is normal. No respiratory distress.     Comments: Clear lung sounds, normal pulses, no edema or JVD Skin:    General: Skin is warm and dry.     Findings: No erythema or rash.  Neurological:     Mental Status: He is alert.     Coordination: Coordination normal.     ED Results / Procedures / Treatments   Labs (all labs ordered are  listed, but only abnormal results are displayed) Labs Reviewed  SARS CORONAVIRUS 2 (TAT 6-24 HRS)    EKG None  Radiology No results found.  Procedures Procedures (including critical care time)  Medications Ordered in ED Medications - No data to display  ED Course  I have reviewed the triage vital signs and the nursing notes.  Pertinent labs & imaging results that were available during my care of the patient were reviewed by me and considered in my medical decision making (see chart for details).    MDM Rules/Calculators/A&P                      Well-appearing, likely common cold, check for Covid, stable for discharge  Final Clinical Impression(s) / ED Diagnoses Final diagnoses:  Viral upper respiratory tract infection    Rx / DC Orders ED Discharge Orders         Ordered    fluticasone (FLONASE)  50 MCG/ACT nasal spray  Daily     09/08/19 2204    benzonatate (TESSALON) 100 MG capsule  2 times daily PRN     09/08/19 2204           Eber Hong, MD 09/08/19 2207

## 2019-09-09 LAB — SARS CORONAVIRUS 2 (TAT 6-24 HRS): SARS Coronavirus 2: NEGATIVE

## 2019-09-15 ENCOUNTER — Emergency Department (HOSPITAL_COMMUNITY)
Admission: EM | Admit: 2019-09-15 | Discharge: 2019-09-16 | Disposition: A | Discharge status: Home / Self Care | Payer: Self-pay | Attending: Emergency Medicine | Admitting: Emergency Medicine

## 2019-09-15 ENCOUNTER — Encounter (HOSPITAL_COMMUNITY): Payer: Self-pay | Admitting: Emergency Medicine

## 2019-09-15 ENCOUNTER — Emergency Department (HOSPITAL_COMMUNITY): Payer: Self-pay

## 2019-09-15 ENCOUNTER — Other Ambulatory Visit: Payer: Self-pay

## 2019-09-15 DIAGNOSIS — R0602 Shortness of breath: Secondary | ICD-10-CM | POA: Insufficient documentation

## 2019-09-15 DIAGNOSIS — Z20822 Contact with and (suspected) exposure to covid-19: Secondary | ICD-10-CM | POA: Insufficient documentation

## 2019-09-15 DIAGNOSIS — F17210 Nicotine dependence, cigarettes, uncomplicated: Secondary | ICD-10-CM | POA: Insufficient documentation

## 2019-09-15 DIAGNOSIS — J40 Bronchitis, not specified as acute or chronic: Secondary | ICD-10-CM | POA: Insufficient documentation

## 2019-09-15 MED ORDER — DEXAMETHASONE SODIUM PHOSPHATE 10 MG/ML IJ SOLN
10.0000 mg | Freq: Once | INTRAMUSCULAR | Status: AC
  Administered 2019-09-15: 10 mg via INTRAMUSCULAR
  Filled 2019-09-15: qty 1

## 2019-09-15 MED ORDER — ALBUTEROL SULFATE HFA 108 (90 BASE) MCG/ACT IN AERS
2.0000 | INHALATION_SPRAY | Freq: Once | RESPIRATORY_TRACT | Status: AC
  Administered 2019-09-15: 2 via RESPIRATORY_TRACT
  Filled 2019-09-15: qty 6.7

## 2019-09-15 NOTE — ED Provider Notes (Signed)
[facility] EMERGENCY DEPARTMENT Provider Note   CSN: [id number] Arrival date & time: 09/15/19  2210     History Chief Complaint  Patient presents with  . Shortness of Breath    Lee Meyer is a 34 y.o. male.  HPI     This is a 34 year old male who presents with cough and shortness of breath.  Was diagnosed with URI last week.  States that since that time his congestion has improved but he continues to have a cough with shortness of breath.  No chest pain.  Patient is a smoker but states he has not smoked since onset of symptoms.  Denies leg swelling or history of blood clots.  Denies fevers.  No known Covid contacts or sick exposures.  Does report that he used his family's nebulizer with some relief.  No history of asthma.  History reviewed. No pertinent past medical history.  There are no problems to display for this patient.   History reviewed. No pertinent surgical history.     No family history on file.  Social History   Tobacco Use  . Smoking status: Current Every Day Smoker    Packs/day: 0.50    Types: Cigarettes  . Smokeless tobacco: Never Used  Substance Use Topics  . Alcohol use: No    Comment: occ  . Drug use: Not Currently    Types: Marijuana    Home Medications Prior to Admission medications   Medication Sig Start Date End Date Taking? Authorizing Provider  albuterol (VENTOLIN HFA) 108 (90 Base) MCG/ACT inhaler Inhale 2 puffs into the lungs every 4 (four) hours as needed for wheezing or shortness of breath. 09/16/19   Mio Schellinger, Mayer Masker, MD  benzonatate (TESSALON) 100 MG capsule Take 2 capsules (200 mg total) by mouth 2 (two) times daily as needed for cough. 09/08/19   Eber Hong, MD  fluticasone (FLONASE) 50 MCG/ACT nasal spray Place 2 sprays into both nostrils daily. 09/08/19   Eber Hong, MD    Allergies    Patient has no known allergies.  Review of Systems   Review of Systems  Constitutional: Negative for fever.  Respiratory: Positive for  cough, shortness of breath and wheezing.   Cardiovascular: Negative for chest pain and leg swelling.  Gastrointestinal: Negative for abdominal pain, nausea and vomiting.  Genitourinary: Negative for dysuria.  All other systems reviewed and are negative.   Physical Exam Updated Vital Signs BP (!) 149/92   Pulse (!) 111   Temp 99 F (37.2 C) (Oral)   Resp 18   Ht 1.727 m (5\' 8" )   Wt 62.1 kg   SpO2 98%   BMI 20.83 kg/m   Physical Exam Vitals and nursing note reviewed.  Constitutional:      Appearance: He is well-developed.  HENT:     Head: Normocephalic and atraumatic.  Eyes:     Pupils: Pupils are equal, round, and reactive to light.  Cardiovascular:     Rate and Rhythm: Normal rate and regular rhythm.     Heart sounds: Normal heart sounds. No murmur.  Pulmonary:     Effort: Pulmonary effort is normal. No respiratory distress.     Breath sounds: Wheezing present.  Abdominal:     General: Bowel sounds are normal.     Palpations: Abdomen is soft.     Tenderness: There is no abdominal tenderness. There is no rebound.  Musculoskeletal:     Cervical back: Neck supple.     Right lower leg: No  tenderness. No edema.     Left lower leg: No tenderness. No edema.  Skin:    General: Skin is warm and dry.  Neurological:     Mental Status: He is alert and oriented to person, place, and time.  Psychiatric:        Mood and Affect: Mood normal.     ED Results / Procedures / Treatments   Labs (all labs ordered are listed, but only abnormal results are displayed) Labs Reviewed  POC SARS CORONAVIRUS 2 AG -  ED    EKG None  Radiology DG Chest Portable 1 View  Result Date: 09/15/2019 CLINICAL DATA:  Shortness of breath for 1 week EXAM: PORTABLE CHEST 1 VIEW COMPARISON:  03/29/15 FINDINGS: The heart size and mediastinal contours are within normal limits. Both lungs are clear. The visualized skeletal structures are unremarkable. IMPRESSION: No active disease. Electronically  Signed   By: Inez Catalina M.D.   On: 09/15/2019 23:46    Procedures Procedures (including critical care time)  Medications Ordered in ED Medications  dexamethasone (DECADRON) injection 10 mg (10 mg Intramuscular Given 09/15/19 2326)  albuterol (VENTOLIN HFA) 108 (90 Base) MCG/ACT inhaler 2 puff (2 puffs Inhalation Given 09/15/19 2326)    ED Course  I have reviewed the triage vital signs and the nursing notes.  Pertinent labs & imaging results that were available during my care of the patient were reviewed by me and considered in my medical decision making (see chart for details).    MDM Rules/Calculators/A&P                       Patient presents with ongoing shortness of breath.  Overall nontoxic-appearing and vital signs are notable for slight tachycardia.  He is wheezing on exam.  Recent upper respiratory symptoms likely viral in nature.  Given his history of smoking and wheezing on exam, patient was given Decadron and albuterol.  Chest x-ray without evidence of pneumothorax or pneumonia.  Covid testing is negative.  Patient has remained hemodynamically stable.  Suspect acute bronchitis given wheezing and improvement with albuterol.  Doubt other etiology including PE.  Will discharge with albuterol as needed.  [name] was evaluated in Emergency Department on 09/16/2019 for the symptoms described in the history of present illness. He was evaluated in the context of the global COVID-19 pandemic, which necessitated consideration that the patient might be at risk for infection with the SARS-CoV-2 virus that causes COVID-19. Institutional protocols and algorithms that pertain to the evaluation of patients at risk for COVID-19 are in a state of rapid change based on information released by regulatory bodies including the CDC and federal and state organizations. These policies and algorithms were followed during the patient's care in the ED.  After history, exam, and medical workup I feel the  patient has been appropriately medically screened and is safe for discharge home. Pertinent diagnoses were discussed with the patient. Patient was given return precautions.   Final Clinical Impression(s) / ED Diagnoses Final diagnoses:  Bronchitis    Rx / DC Orders ED Discharge Orders         Ordered    albuterol (VENTOLIN HFA) 108 (90 Base) MCG/ACT inhaler  Every 4 hours PRN     09/16/19 0110           Merryl Hacker, MD 09/16/19 [phone]

## 2019-09-15 NOTE — ED Triage Notes (Signed)
Pt c/o increased sob since last week. Pt seen for the same last week and dx with uri.

## 2019-09-16 LAB — POC SARS CORONAVIRUS 2 AG -  ED: SARS Coronavirus 2 Ag: NEGATIVE

## 2019-09-16 MED ORDER — ALBUTEROL SULFATE HFA 108 (90 BASE) MCG/ACT IN AERS: 2.0000 | INHALATION_SPRAY | RESPIRATORY_TRACT | 0 refills | Status: DC | PRN

## 2019-09-16 NOTE — Discharge Instructions (Addendum)
You were seen today for ongoing shortness of breath.  You are wheezing on exam.  This is likely related to your recent URI and history of smoking.  Avoid smoking.  Use your inhaler every 2-4 hours as needed.

## 2019-10-21 ENCOUNTER — Encounter (HOSPITAL_COMMUNITY): Payer: Self-pay | Admitting: Emergency Medicine

## 2019-10-21 ENCOUNTER — Other Ambulatory Visit: Payer: Self-pay

## 2019-10-21 DIAGNOSIS — F17210 Nicotine dependence, cigarettes, uncomplicated: Secondary | ICD-10-CM | POA: Insufficient documentation

## 2019-10-21 DIAGNOSIS — Z79899 Other long term (current) drug therapy: Secondary | ICD-10-CM | POA: Insufficient documentation

## 2019-10-21 DIAGNOSIS — J4521 Mild intermittent asthma with (acute) exacerbation: Secondary | ICD-10-CM | POA: Insufficient documentation

## 2019-10-21 NOTE — ED Triage Notes (Addendum)
Patient states shortness of breath that started about a month ago. Patient states that he was seen here 1 month ago and that he was given an inhaler for asthma. Patient states shortness of breath tonight and that he used his inhaler tonight with no relief.

## 2019-10-22 ENCOUNTER — Emergency Department (HOSPITAL_COMMUNITY): Payer: Self-pay

## 2019-10-22 ENCOUNTER — Emergency Department (HOSPITAL_COMMUNITY)
Admission: EM | Admit: 2019-10-22 | Discharge: 2019-10-22 | Disposition: A | Discharge status: Home / Self Care | Payer: Self-pay | Attending: Emergency Medicine | Admitting: Emergency Medicine

## 2019-10-22 DIAGNOSIS — J4521 Mild intermittent asthma with (acute) exacerbation: Secondary | ICD-10-CM

## 2019-10-22 HISTORY — DX: Unspecified asthma, uncomplicated: J45.909

## 2019-10-22 MED ORDER — ALBUTEROL SULFATE HFA 108 (90 BASE) MCG/ACT IN AERS
2.0000 | INHALATION_SPRAY | Freq: Once | RESPIRATORY_TRACT | Status: AC
  Administered 2019-10-22: 02:00:00 2 via RESPIRATORY_TRACT
  Filled 2019-10-22: qty 6.7

## 2019-10-22 MED ORDER — PREDNISONE 20 MG PO TABS: 60.0000 mg | ORAL_TABLET | Freq: Every day | ORAL | 0 refills | Status: DC

## 2019-10-22 MED ORDER — ALBUTEROL SULFATE HFA 108 (90 BASE) MCG/ACT IN AERS: 2.0000 | INHALATION_SPRAY | RESPIRATORY_TRACT | 0 refills | Status: DC | PRN

## 2019-10-22 MED ORDER — PREDNISONE 50 MG PO TABS
60.0000 mg | ORAL_TABLET | Freq: Once | ORAL | Status: AC
  Administered 2019-10-22: 60 mg via ORAL
  Filled 2019-10-22: qty 1

## 2019-10-22 NOTE — ED Provider Notes (Signed)
[facility] EMERGENCY DEPARTMENT Provider Note   CSN: [id number] Arrival date & time: 10/21/19  2250   History Chief Complaint  Patient presents with  . Shortness of Breath    Lee Meyer is a 34 y.o. male.  The history is provided by the patient.  Shortness of Breath He has history of asthma and comes in because of ongoing cough and shortness of breath.  He was seen here last month and diagnosed with bronchitis and given a prescription for albuterol inhaler.  He states he has been using it 4-6 times a day.  It was generally helpful, but tonight it was not working.  He continues to have a cough productive of yellow sputum.  He denies fever, chills, sweats.  He denies nausea or vomiting.  Denies any change in sense of smell or taste.  He denies exposure to COVID-19.  He has not been vaccinated for COVID-19.  He has not smoked in the last 5 weeks.  Past Medical History:  Diagnosis Date  . Asthma     There are no problems to display for this patient.   History reviewed. No pertinent surgical history.     History reviewed. No pertinent family history.  Social History   Tobacco Use  . Smoking status: Current Every Day Smoker    Packs/day: 0.50    Types: Cigarettes  . Smokeless tobacco: Never Used  Substance Use Topics  . Alcohol use: No    Comment: occ  . Drug use: Not Currently    Types: Marijuana    Home Medications Prior to Admission medications   Medication Sig Start Date End Date Taking? Authorizing Provider  albuterol (VENTOLIN HFA) 108 (90 Base) MCG/ACT inhaler Inhale 2 puffs into the lungs every 4 (four) hours as needed for wheezing or shortness of breath. 09/16/19   Horton, Mayer Masker, MD  benzonatate (TESSALON) 100 MG capsule Take 2 capsules (200 mg total) by mouth 2 (two) times daily as needed for cough. 09/08/19   Eber Hong, MD  fluticasone (FLONASE) 50 MCG/ACT nasal spray Place 2 sprays into both nostrils daily. 09/08/19   Eber Hong, MD    Allergies     Patient has no known allergies.  Review of Systems   Review of Systems  Respiratory: Positive for shortness of breath.   All other systems reviewed and are negative.   Physical Exam Updated Vital Signs BP 132/72 (BP Location: Left Arm)   Pulse 93   Temp 98.2 F (36.8 C) (Oral)   Resp 20   Ht 5\' 8"  (1.727 m)   Wt 62.1 kg   SpO2 98%   BMI 20.83 kg/m   Physical Exam Vitals and nursing note reviewed.   34 year old male, resting comfortably and in no acute distress. Vital signs are normal. Oxygen saturation is 98%, which is normal. Head is normocephalic and atraumatic. PERRLA, EOMI. Oropharynx is clear. Neck is nontender and supple without adenopathy or JVD. Back is nontender and there is no CVA tenderness. Lungs are clear without overt rales, wheezes, or rhonchi.  There is a slightly prolonged exhalation phase. Chest is nontender. Heart has regular rate and rhythm without murmur. Abdomen is soft, flat, nontender without masses or hepatosplenomegaly and peristalsis is normoactive. Extremities have no cyanosis or edema, full range of motion is present. Skin is warm and dry without rash. Neurologic: Mental status is normal, cranial nerves are intact, there are no motor or sensory deficits.  ED Results / Procedures / Treatments  Radiology DG Chest Port 1 View  Result Date: 10/22/2019 CLINICAL DATA:  Cough and shortness of breath, asthma, tobacco abuse EXAM: PORTABLE CHEST 1 VIEW COMPARISON:  09/15/2019 FINDINGS: Two frontal views of the chest demonstrate an unremarkable cardiac silhouette. No airspace disease, effusion, or pneumothorax. No acute bony abnormalities. IMPRESSION: 1. No acute intrathoracic process. Electronically Signed   By: Randa Ngo M.D.   On: 10/22/2019 01:44    Procedures Procedures   Medications Ordered in ED Medications  albuterol (VENTOLIN HFA) 108 (90 Base) MCG/ACT inhaler 2 puff (2 puffs Inhalation Given 10/22/19 0209)  predniSONE (DELTASONE)  tablet 60 mg (60 mg Oral Given 10/22/19 0209)    ED Course  I have reviewed the triage vital signs and the nursing notes.  Pertinent labs & imaging results that were available during my care of the patient were reviewed by me and considered in my medical decision making (see chart for details).  Cough and dyspnea which are probably exacerbation of asthma.  Old records are reviewed confirming ED visit 1 month ago at which time he was given prescription for albuterol inhaler and a dose of dexamethasone.  Will check chest x-ray to rule out pneumonia, and will give dose of prednisone and 2 puffs from an albuterol inhaler.  Chest x-ray shows no evidence of pneumonia.  He feels much better after using the inhaler in the emergency department.  He is discharged with prescription for a new albuterol inhaler, and also a prescription for a 5-day course of prednisone.  Return precautions discussed.  MDM Rules/Calculators/A&P NOLYN SWAB was evaluated in Emergency Department on 10/22/2019 for the symptoms described in the history of present illness. He was evaluated in the context of the global COVID-19 pandemic, which necessitated consideration that the patient might be at risk for infection with the SARS-CoV-2 virus that causes COVID-19. Institutional protocols and algorithms that pertain to the evaluation of patients at risk for COVID-19 are in a state of rapid change based on information released by regulatory bodies including the CDC and federal and state organizations. These policies and algorithms were followed during the patient's care in the ED.  Final Clinical Impression(s) / ED Diagnoses Final diagnoses:  Mild intermittent asthma with exacerbation    Rx / DC Orders ED Discharge Orders         Ordered    albuterol (VENTOLIN HFA) 108 (90 Base) MCG/ACT inhaler  Every 4 hours PRN     10/22/19 0248    predniSONE (DELTASONE) 20 MG tablet  Daily     10/22/19 0923           Delora Fuel, MD  [birth year] [phone]

## 2020-05-26 ENCOUNTER — Emergency Department (HOSPITAL_COMMUNITY)
Admission: EM | Admit: 2020-05-26 | Discharge: 2020-05-26 | Disposition: A | Discharge status: Home / Self Care | Payer: Self-pay | Attending: Emergency Medicine | Admitting: Emergency Medicine

## 2020-05-26 ENCOUNTER — Other Ambulatory Visit: Payer: Self-pay

## 2020-05-26 ENCOUNTER — Encounter (HOSPITAL_COMMUNITY): Payer: Self-pay | Admitting: Emergency Medicine

## 2020-05-26 DIAGNOSIS — R197 Diarrhea, unspecified: Secondary | ICD-10-CM | POA: Insufficient documentation

## 2020-05-26 DIAGNOSIS — J45909 Unspecified asthma, uncomplicated: Secondary | ICD-10-CM | POA: Insufficient documentation

## 2020-05-26 DIAGNOSIS — F17210 Nicotine dependence, cigarettes, uncomplicated: Secondary | ICD-10-CM | POA: Insufficient documentation

## 2020-05-26 NOTE — Discharge Instructions (Signed)
Please follow-up with your primary care after.  Please drink plenty of water.  You may continue his Pepto-Bismol as needed.

## 2020-05-26 NOTE — ED Triage Notes (Addendum)
Patient states he missed work due to a stomach bug and needs a note to return to work. Denies illness at this time.

## 2020-05-26 NOTE — ED Provider Notes (Signed)
[facility] EMERGENCY DEPARTMENT Provider Note   CSN: [id number] Arrival date & time: 05/26/20  1116     History Chief Complaint  Patient presents with  . work note    Lee Meyer is a 34 y.o. male.  HPI Patient is a 34 year old male presented today with no current symptoms states that the past 2 days he has had some diarrhea. He states that it was watering 2-3 episodes per day.  He states he was able to drink water and Gatorade and keep food down he had no nausea or vomiting or abdominal pain.  He denies any other associated symptoms.  No aggravating or mitigating factors.  He has taken Pepto-Bismol with good response.  No fevers chills or bloody diarrhea.    Past Medical History:  Diagnosis Date  . Asthma     There are no problems to display for this patient.   History reviewed. No pertinent surgical history.     History reviewed. No pertinent family history.  Social History   Tobacco Use  . Smoking status: Current Every Day Smoker    Packs/day: 0.50    Types: Cigarettes  . Smokeless tobacco: Never Used  Vaping Use  . Vaping Use: Never used  Substance Use Topics  . Alcohol use: No    Comment: occ  . Drug use: Not Currently    Types: Marijuana    Home Medications Prior to Admission medications   Medication Sig Start Date End Date Taking? Authorizing Provider  albuterol (VENTOLIN HFA) 108 (90 Base) MCG/ACT inhaler Inhale 2 puffs into the lungs every 4 (four) hours as needed for wheezing or shortness of breath. 10/22/19   Dione Booze, MD  fluticasone [place]) 50 MCG/ACT nasal spray Place 2 sprays into both nostrils daily. 09/08/19   Eber Hong, MD  predniSONE (DELTASONE) 20 MG tablet Take 3 tablets (60 mg total) by mouth daily. 10/22/19   Dione Booze, MD    Allergies    Patient has no known allergies.  Review of Systems   Review of Systems  Constitutional: Negative for chills and fever.  HENT: Negative for congestion.   Respiratory: Negative  for shortness of breath.   Cardiovascular: Negative for chest pain.  Gastrointestinal: Positive for diarrhea (resolved ). Negative for abdominal pain.  Musculoskeletal: Negative for neck pain.    Physical Exam Updated Vital Signs BP (!) 154/102 (BP Location: Right Arm)   Pulse 77   Temp 98.2 F (36.8 C) (Oral)   Resp 18   Ht 5\' 8"  (1.727 m)   Wt 63.5 kg   SpO2 100%   BMI 21.29 kg/m   Physical Exam Vitals and nursing note reviewed.  Constitutional:      General: He is not in acute distress.    Appearance: He is not ill-appearing.     Comments: Pleasant well-appearing 34 year old.  In no acute distress.  Sitting comfortably in bed.  Able answer questions appropriately follow commands. No increased work of breathing. Speaking in full sentences.  HENT:     Head: Normocephalic and atraumatic.     Nose: Nose normal.  Eyes:     General: No scleral icterus. Cardiovascular:     Rate and Rhythm: Normal rate.  Pulmonary:     Effort: Pulmonary effort is normal. No respiratory distress.  Abdominal:     Palpations: Abdomen is soft.     Tenderness: There is no abdominal tenderness.  Musculoskeletal:     Cervical back: Normal range of motion.  Right lower leg: No edema.     Left lower leg: No edema.  Skin:    General: Skin is warm and dry.     Capillary Refill: Capillary refill takes less than 2 seconds.  Neurological:     Mental Status: He is alert. Mental status is at baseline.  Psychiatric:        Mood and Affect: Mood normal.        Behavior: Behavior normal.     ED Results / Procedures / Treatments   Labs (all labs ordered are listed, but only abnormal results are displayed) Labs Reviewed - No data to display  EKG None  Radiology No results found.  Procedures Procedures (including critical care time)  Medications Ordered in ED Medications - No data to display  ED Course  I have reviewed the triage vital signs and the nursing notes.  Pertinent labs &  imaging results that were available during my care of the patient were reviewed by me and considered in my medical decision making (see chart for details).    MDM Rules/Calculators/A&P                          Patient is a asymptomatic well-appearing 34 year old male presented today with request for work note in order to return to work tomorrow.  He has had 2 days of diarrhea he had no nausea or vomiting.  His diarrhea seems to have resolved with Pepto-Bismol.  His vital signs within normal his pressure mild hypertension which I discussed with him and recommended following up with PCP.  This may also be related to being in the ER today.  Recommend drinking plenty of water given return precautions.  Physical exam is unremarkable.  We will discharge at this time with work note.  Final Clinical Impression(s) / ED Diagnoses Final diagnoses:  Diarrhea, unspecified type    Rx / DC Orders ED Discharge Orders    None       Gailen Shelter, [place] 05/26/20 1231    Bethann Berkshire, MD 05/28/20 [phone]

## 2020-06-02 ENCOUNTER — Emergency Department (HOSPITAL_COMMUNITY)
Admission: EM | Admit: 2020-06-02 | Discharge: 2020-06-02 | Disposition: A | Discharge status: Home / Self Care | Payer: Self-pay | Attending: Emergency Medicine | Admitting: Emergency Medicine

## 2020-06-02 ENCOUNTER — Encounter (HOSPITAL_COMMUNITY): Payer: Self-pay | Admitting: Emergency Medicine

## 2020-06-02 ENCOUNTER — Other Ambulatory Visit: Payer: Self-pay

## 2020-06-02 DIAGNOSIS — F17210 Nicotine dependence, cigarettes, uncomplicated: Secondary | ICD-10-CM | POA: Insufficient documentation

## 2020-06-02 DIAGNOSIS — Z7951 Long term (current) use of inhaled steroids: Secondary | ICD-10-CM | POA: Insufficient documentation

## 2020-06-02 DIAGNOSIS — R369 Urethral discharge, unspecified: Secondary | ICD-10-CM | POA: Insufficient documentation

## 2020-06-02 DIAGNOSIS — J45909 Unspecified asthma, uncomplicated: Secondary | ICD-10-CM | POA: Insufficient documentation

## 2020-06-02 DIAGNOSIS — R300 Dysuria: Secondary | ICD-10-CM | POA: Insufficient documentation

## 2020-06-02 LAB — RAPID HIV SCREEN (HIV 1/2 AB+AG)
HIV 1/2 Antibodies: NONREACTIVE
HIV-1 P24 Antigen - HIV24: NONREACTIVE

## 2020-06-02 MED ORDER — LIDOCAINE HCL (PF) 1 % IJ SOLN
INTRAMUSCULAR | Status: AC
  Filled 2020-06-02: qty 30

## 2020-06-02 MED ORDER — DOXYCYCLINE HYCLATE 100 MG PO TABS
100.0000 mg | ORAL_TABLET | Freq: Once | ORAL | Status: AC
  Administered 2020-06-02: 100 mg via ORAL
  Filled 2020-06-02: qty 1

## 2020-06-02 MED ORDER — CEFTRIAXONE SODIUM 500 MG IJ SOLR
500.0000 mg | Freq: Once | INTRAMUSCULAR | Status: AC
  Administered 2020-06-02: 500 mg via INTRAMUSCULAR
  Filled 2020-06-02: qty 500

## 2020-06-02 MED ORDER — DOXYCYCLINE HYCLATE 100 MG PO CAPS: 100.0000 mg | ORAL_CAPSULE | Freq: Two times a day (BID) | ORAL | 0 refills | Status: AC

## 2020-06-02 NOTE — Discharge Instructions (Signed)
You have been treated presumptively today for gonorrhea and you have been prescribed medication to cover for chlamydia.  Please take your antibiotic, as prescribed.  Take with food.  You have been tested today for gonorrhea and chlamydia. These results will be available in approximately 3 days. You may check your MyChart account for results. Please inform all sexual partners of positive results and that they should be tested and treated as well.  Please wait 2 weeks and be sure that you and your partners are symptom free before returning to sexual activity. Please use protection with every sexual encounter.  Follow Up: Please followup with your primary doctor in 3 days for discussion of your diagnoses and further evaluation after today's visit; if you do not have a primary care doctor use the resource guide provided to find one; Please return to the ER for worsening symptoms, high fevers or persistent vomiting.  

## 2020-06-02 NOTE — ED Triage Notes (Signed)
Pt states he has penile discharge that began today. Pt states he has recently had unprotected sex.

## 2020-06-02 NOTE — ED Provider Notes (Signed)
[facility] EMERGENCY DEPARTMENT Provider Note   CSN: [id number] Arrival date & time: 06/02/20  1505     History Chief Complaint  Patient presents with  . possible STI    Lee Meyer is a 34 y.o. male with no relevant past medical history presents the ED with 1 day history of penile discharge.  Patient reports that he has had 2 male partners in the past 30 days, he did not wear protection.  He noticed a whitish thin discharge from his penis this morning in addition to mild dysuria.  He denies any testicular pain or swelling.  He also denies any fevers, chills, abdominal pain, pain with defecation, or other symptoms.  He would also like to be tested for HIV and syphilis.  HPI     Past Medical History:  Diagnosis Date  . Asthma     There are no problems to display for this patient.   History reviewed. No pertinent surgical history.     History reviewed. No pertinent family history.  Social History   Tobacco Use  . Smoking status: Current Every Day Smoker    Packs/day: 0.50    Types: Cigarettes  . Smokeless tobacco: Never Used  Vaping Use  . Vaping Use: Never used  Substance Use Topics  . Alcohol use: No    Comment: occ  . Drug use: Not Currently    Types: Marijuana    Comment: denies    Home Medications Prior to Admission medications   Medication Sig Start Date End Date Taking? Authorizing Provider  albuterol (VENTOLIN HFA) 108 (90 Base) MCG/ACT inhaler Inhale 2 puffs into the lungs every 4 (four) hours as needed for wheezing or shortness of breath. 10/22/19   Dione Booze, MD  doxycycline (VIBRAMYCIN) 100 MG capsule Take 1 capsule (100 mg total) by mouth 2 (two) times daily for 7 days. 06/02/20 06/09/20  Lorelee New, PA-C  fluticasone (FLONASE) 50 MCG/ACT nasal spray Place 2 sprays into both nostrils daily. 09/08/19   Eber Hong, MD  predniSONE (DELTASONE) 20 MG tablet Take 3 tablets (60 mg total) by mouth daily. 10/22/19   Dione Booze, MD     Allergies    Patient has no known allergies.  Review of Systems   Review of Systems  Constitutional: Negative for fever.  Gastrointestinal: Negative for abdominal pain.  Genitourinary: Positive for discharge and dysuria. Negative for penile pain, penile swelling, scrotal swelling and testicular pain.    Physical Exam Updated Vital Signs BP (!) 144/77 (BP Location: Right Arm)   Pulse 87   Temp 98.2 F (36.8 C) (Oral)   Resp 18   Ht 5\' 8"  (1.727 m)   Wt 66.7 kg   SpO2 100%   BMI 22.35 kg/m   Physical Exam Vitals and nursing note reviewed. Exam conducted with a chaperone present.  Constitutional:      General: He is not in acute distress.    Appearance: Normal appearance. He is not ill-appearing.  HENT:     Head: Normocephalic and atraumatic.  Eyes:     General: No scleral icterus.    Conjunctiva/sclera: Conjunctivae normal.  Cardiovascular:     Rate and Rhythm: Normal rate.     Pulses: Normal pulses.  Pulmonary:     Effort: Pulmonary effort is normal. No respiratory distress.  Abdominal:     General: Abdomen is flat. There is no distension.     Palpations: Abdomen is soft.     Tenderness: There is no abdominal  tenderness. There is no guarding.  Genitourinary:    Penis: Normal.      Testes: Normal.     Comments: No rashes.  Mild expressible thin white discharge.  No testicular tenderness or swelling noted. Skin:    General: Skin is dry.  Neurological:     Mental Status: He is alert and oriented to person, place, and time.     GCS: GCS eye subscore is 4. GCS verbal subscore is 5. GCS motor subscore is 6.  Psychiatric:        Mood and Affect: Mood normal.        Behavior: Behavior normal.        Thought Content: Thought content normal.     ED Results / Procedures / Treatments   Labs (all labs ordered are listed, but only abnormal results are displayed) Labs Reviewed  RAPID HIV SCREEN (HIV 1/2 AB+AG)  RPR  GC/CHLAMYDIA PROBE AMP () NOT AT  [facility]    EKG None  Radiology No results found.  Procedures Procedures (including critical care time)  Medications Ordered in ED Medications  doxycycline (VIBRA-TABS) tablet 100 mg (has no administration in time range)  cefTRIAXone (ROCEPHIN) injection 500 mg (has no administration in time range)    ED Course  I have reviewed the triage vital signs and the nursing notes.  Pertinent labs & imaging results that were available during my care of the patient were reviewed by me and considered in my medical decision making (see chart for details).    MDM Rules/Calculators/A&P                          Patient's history and physical exam suspicious for STI.  Patient declined swab, will obtain urine specimen to test for GC.  Patient is afebrile without abdominal tenderness, abdominal pain or painful bowel movements to indicate prostatitis.  No tenderness to palpation of the testes or epididymis to suggest orchitis or epididymitis.  STD cultures obtained including HIV, syphilis, gonorrhea and chlamydia. Patient to be discharged with instructions to follow up with PCP. Discussed importance of using protection when sexually active. Pt understands that they have GC/Chlamydia cultures pending and that they will need to inform all sexual partners if results return positive. Patient has been treated prophylactically with Rocephin and prescribed doxycycline.   Patient states that he plans to wear condoms moving forward.  ED return precautions discussed.  Patient voices understanding and is agreeable with plan.  Final Clinical Impression(s) / ED Diagnoses Final diagnoses:  Penile discharge    Rx / DC Orders ED Discharge Orders         Ordered    doxycycline (VIBRAMYCIN) 100 MG capsule  2 times daily        06/02/20 1613           Lorelee New, PA-C 06/02/20 1614    Terrilee Files, MD 06/03/20 1009

## 2020-06-03 LAB — RPR: RPR Ser Ql: NONREACTIVE

## 2020-06-04 LAB — GC/CHLAMYDIA PROBE AMP (~~LOC~~) NOT AT ARMC
Chlamydia: POSITIVE — AB
Comment: NEGATIVE
Comment: NORMAL
Neisseria Gonorrhea: NEGATIVE

## 2020-06-20 ENCOUNTER — Emergency Department (HOSPITAL_COMMUNITY)
Admission: EM | Admit: 2020-06-20 | Discharge: 2020-06-20 | Disposition: A | Discharge status: Home / Self Care | Payer: Self-pay | Attending: Emergency Medicine | Admitting: Emergency Medicine

## 2020-06-20 ENCOUNTER — Encounter (HOSPITAL_COMMUNITY): Payer: Self-pay | Admitting: Emergency Medicine

## 2020-06-20 ENCOUNTER — Other Ambulatory Visit: Payer: Self-pay

## 2020-06-20 DIAGNOSIS — J45909 Unspecified asthma, uncomplicated: Secondary | ICD-10-CM | POA: Insufficient documentation

## 2020-06-20 DIAGNOSIS — F17210 Nicotine dependence, cigarettes, uncomplicated: Secondary | ICD-10-CM | POA: Insufficient documentation

## 2020-06-20 DIAGNOSIS — Z760 Encounter for issue of repeat prescription: Secondary | ICD-10-CM | POA: Insufficient documentation

## 2020-06-20 DIAGNOSIS — Z7951 Long term (current) use of inhaled steroids: Secondary | ICD-10-CM | POA: Insufficient documentation

## 2020-06-20 MED ORDER — DOXYCYCLINE HYCLATE 100 MG PO CAPS: 100.0000 mg | ORAL_CAPSULE | Freq: Two times a day (BID) | ORAL | 0 refills | Status: AC

## 2020-06-20 NOTE — ED Provider Notes (Signed)
[facility] EMERGENCY DEPARTMENT Provider Note   CSN: [id number] Arrival date & time: 06/20/20  4782     History Chief Complaint  Patient presents with  . Medication Refill    Lee Meyer is a 34 y.o. male with a past medical history significant for asthma who presents to the ED for a medication refill.  Patient was seen in the ED on 06/02/2020 due to penile discharge and was prophylactically treated with Rocephin and doxycycline.  Results reviewed and patient was positive for chlamydia.  Patient states he lost his doxycycline and did not finish all of his antibiotics. He states he took roughly 2 to 3 pills with improvement in symptoms however, his symptoms shortly returned after he lost his pills.  Denies fever, chills, abdominal pain, pain with defecation, testicular pain, testicular swelling, and penile pain.  He has not had intercourse since his previous ED visit.  History obtained from patient and past medical records. No interpreter used during encounter.      Past Medical History:  Diagnosis Date  . Asthma     There are no problems to display for this patient.   History reviewed. No pertinent surgical history.     History reviewed. No pertinent family history.  Social History   Tobacco Use  . Smoking status: Current Every Day Smoker    Packs/day: 0.50    Types: Cigarettes  . Smokeless tobacco: Never Used  Vaping Use  . Vaping Use: Never used  Substance Use Topics  . Alcohol use: No    Comment: occ  . Drug use: Yes    Types: Marijuana    Comment: denies    Home Medications Prior to Admission medications   Medication Sig Start Date End Date Taking? Authorizing Provider  albuterol (VENTOLIN HFA) 108 (90 Base) MCG/ACT inhaler Inhale 2 puffs into the lungs every 4 (four) hours as needed for wheezing or shortness of breath. 10/22/19   Dione Booze, MD  doxycycline (VIBRAMYCIN) 100 MG capsule Take 1 capsule (100 mg total) by mouth 2 (two) times daily for 7  days. 06/20/20 06/27/20  Mannie Stabile, PA-C  fluticasone (FLONASE) 50 MCG/ACT nasal spray Place 2 sprays into both nostrils daily. 09/08/19   Eber Hong, MD  predniSONE (DELTASONE) 20 MG tablet Take 3 tablets (60 mg total) by mouth daily. 10/22/19   Dione Booze, MD    Allergies    Patient has no known allergies.  Review of Systems   Review of Systems  Constitutional: Negative for chills and fever.  Gastrointestinal: Negative for abdominal pain.  Genitourinary: Positive for penile discharge. Negative for dysuria, hematuria, penile pain, penile swelling, scrotal swelling and testicular pain.  All other systems reviewed and are negative.   Physical Exam Updated Vital Signs BP (!) 142/95 (BP Location: Right Arm)   Pulse 97   Temp 98.2 F (36.8 C) (Oral)   Resp 18   Ht 5\' 8"  (1.727 m)   Wt 66.7 kg   SpO2 100%   BMI 22.35 kg/m   Physical Exam Vitals and nursing note reviewed.  Constitutional:      General: He is not in acute distress.    Appearance: He is not ill-appearing.  HENT:     Head: Normocephalic.  Eyes:     Pupils: Pupils are equal, round, and reactive to light.  Cardiovascular:     Rate and Rhythm: Normal rate and regular rhythm.     Pulses: Normal pulses.     Heart sounds: Normal  heart sounds. No murmur heard. No friction rub. No gallop.   Pulmonary:     Effort: Pulmonary effort is normal.     Breath sounds: Normal breath sounds.  Abdominal:     General: Abdomen is flat. There is no distension.     Palpations: Abdomen is soft.     Tenderness: There is no abdominal tenderness. There is no guarding or rebound.  Genitourinary:    Comments: deferred Musculoskeletal:     Cervical back: Neck supple.  Skin:    General: Skin is warm and dry.  Neurological:     General: No focal deficit present.     Mental Status: He is alert.  Psychiatric:        Mood and Affect: Mood normal.        Behavior: Behavior normal.     ED Results / Procedures /  Treatments   Labs (all labs ordered are listed, but only abnormal results are displayed) Labs Reviewed - No data to display  EKG None  Radiology No results found.  Procedures Procedures (including critical care time)  Medications Ordered in ED Medications - No data to display  ED Course  I have reviewed the triage vital signs and the nursing notes.  Pertinent labs & imaging results that were available during my care of the patient were reviewed by me and considered in my medical decision making (see chart for details).    MDM Rules/Calculators/A&P                         34 year old male presents to the ED for a prescription refill of his doxycycline.  Patient was recently seen in the ED due to penile discharge and was treated prophylactically with Rocephin and doxycycline.  Results reviewed.  Patient tested positive for chlamydia.  Patient took a few pills of doxycycline with improvement in symptoms however, after losing his pills his symptoms shortly returned.  Denies fever, chills, pain with defecation, abdominal pain, dysuria, testicular pain, and testicular edema. Low suspicion for prostatitis. He has not been sexually active since previously treated.  Stable vitals upon arrival.  Patient is afebrile, not tachycardic or hypoxic.  Patient no acute distress and non-ill-appearing.  Physical exam reassuring.  Patient deferred GU exam.  Abdomen soft, nondistended, nontender.  Prescription given for doxycycline.  Advised patient to use condom during intercourse.  Instructed patient to follow-up with PCP if symptoms not improved within the next week. Strict ED precautions discussed with patient. Patient states understanding and agrees to plan. Patient discharged home in no acute distress and stable vitals.  Final Clinical Impression(s) / ED Diagnoses Final diagnoses:  Medication refill    Rx / DC Orders ED Discharge Orders         Ordered    doxycycline (VIBRAMYCIN) 100 MG capsule  2  times daily        06/20/20 1051           Jesusita Oka 06/20/20 1129    Mancel Bale, MD 06/20/20 1718

## 2020-06-20 NOTE — Discharge Instructions (Signed)
As discussed, your chlamydia test was positive.  I am sending you home with an antibiotic called doxycycline.  Take twice a day for 7 days and finish all antibiotics.  Please follow-up with PCP if symptoms do not improve within the next week.  Always use protection during intercourse.  Return to the ER for new or worsening symptoms.

## 2020-06-20 NOTE — ED Triage Notes (Addendum)
Patient seen here in ED x2 weeks ago and diagnosed with STI. Per patient treated and given a prescription. Per patient lost prescribed medication before was complete. Patient states symptoms have not improved.

## 2020-08-29 ENCOUNTER — Emergency Department (HOSPITAL_COMMUNITY)
Admission: EM | Admit: 2020-08-29 | Discharge: 2020-08-30 | Disposition: A | Discharge status: Home / Self Care | Payer: Self-pay | Attending: Emergency Medicine | Admitting: Emergency Medicine

## 2020-08-29 ENCOUNTER — Other Ambulatory Visit: Payer: Self-pay

## 2020-08-29 ENCOUNTER — Encounter (HOSPITAL_COMMUNITY): Payer: Self-pay | Admitting: *Deleted

## 2020-08-29 DIAGNOSIS — Z87891 Personal history of nicotine dependence: Secondary | ICD-10-CM | POA: Insufficient documentation

## 2020-08-29 DIAGNOSIS — J45909 Unspecified asthma, uncomplicated: Secondary | ICD-10-CM | POA: Insufficient documentation

## 2020-08-29 DIAGNOSIS — Z7952 Long term (current) use of systemic steroids: Secondary | ICD-10-CM | POA: Insufficient documentation

## 2020-08-29 DIAGNOSIS — L0201 Cutaneous abscess of face: Secondary | ICD-10-CM | POA: Insufficient documentation

## 2020-08-29 MED ORDER — IBUPROFEN 800 MG PO TABS
800.0000 mg | ORAL_TABLET | Freq: Once | ORAL | Status: AC
  Administered 2020-08-29: 800 mg via ORAL
  Filled 2020-08-29: qty 1

## 2020-08-29 MED ORDER — PENTAFLUOROPROP-TETRAFLUOROETH EX AERO: INHALATION_SPRAY | CUTANEOUS | Status: DC | PRN

## 2020-08-29 MED ORDER — IBUPROFEN 800 MG PO TABS: 800.0000 mg | ORAL_TABLET | Freq: Three times a day (TID) | ORAL | 0 refills | Status: DC

## 2020-08-29 MED ORDER — SULFAMETHOXAZOLE-TRIMETHOPRIM 800-160 MG PO TABS: 1.0000 | ORAL_TABLET | Freq: Two times a day (BID) | ORAL | 0 refills | Status: AC

## 2020-08-29 MED ORDER — POVIDONE-IODINE 10 % EX SOLN
CUTANEOUS | Status: AC
  Filled 2020-08-29: qty 15

## 2020-08-29 MED ORDER — SULFAMETHOXAZOLE-TRIMETHOPRIM 800-160 MG PO TABS
1.0000 | ORAL_TABLET | Freq: Once | ORAL | Status: AC
  Administered 2020-08-29: 1 via ORAL
  Filled 2020-08-29: qty 1

## 2020-08-29 MED ORDER — LIDOCAINE HCL (PF) 2 % IJ SOLN: 5.0000 mL | Freq: Once | INTRAMUSCULAR | Status: DC

## 2020-08-29 MED ORDER — LIDOCAINE HCL (PF) 2 % IJ SOLN
INTRAMUSCULAR | Status: AC
  Filled 2020-08-29: qty 10

## 2020-08-29 NOTE — Discharge Instructions (Addendum)
It will be very important for you to continue warm soaks as discussed using a washcloth in order to keep these small sites open so they continue to drain as this infection resolves.  Apply for if 10 to 15 minutes 3-4 times daily.  Take the entire course of the antibiotics prescribed with your next dose starting tomorrow morning.  Get rechecked for any worsening symptoms.  As discussed ideally this abscess should be opened larger than it currently is, but it may completely resolve with aggressive warm soaks and the antibiotics.

## 2020-08-29 NOTE — ED Notes (Signed)
Pt in bed, pt has swelling/abscess to R jaw.  States that he has a hx of ingrown hairs, but this is much bigger than normal.

## 2020-08-29 NOTE — ED Triage Notes (Signed)
Pt with an abscess to right jaw line x 3 days.

## 2020-08-30 NOTE — ED Provider Notes (Signed)
[facility] EMERGENCY DEPARTMENT Provider Note   CSN: [id number] Arrival date & time: 08/29/20  2025     History Chief Complaint  Patient presents with  . Abscess    Lee Meyer is a 35 y.o. male presenting for treatment of an abscess that started as a small papule in his right facial beard 3 days ago.  He reports prior episodes of similar infections in the same site requiring lancing.  He endorses severe pain at the site.  There has been no drainage despite warm compresses.  He denies fevers, chills, dental pain, difficulty chewing or other sx.    HPI     Past Medical History:  Diagnosis Date  . Asthma     There are no problems to display for this patient.   History reviewed. No pertinent surgical history.     History reviewed. No pertinent family history.  Social History   Tobacco Use  . Smoking status: Former Smoker    Packs/day: 0.50    Types: Cigarettes  . Smokeless tobacco: Never Used  Vaping Use  . Vaping Use: Never used  Substance Use Topics  . Alcohol use: No    Comment: occ  . Drug use: Yes    Types: Marijuana    Home Medications Prior to Admission medications   Medication Sig Start Date End Date Taking? Authorizing Provider  ibuprofen (ADVIL) 800 MG tablet Take 1 tablet (800 mg total) by mouth 3 (three) times daily. 08/29/20  Yes Khyrie Masi, Raynelle Fanning, PA-C  sulfamethoxazole-trimethoprim (BACTRIM DS) 800-160 MG tablet Take 1 tablet by mouth 2 (two) times daily for 10 days. 08/29/20 09/08/20 Yes Dakhari Zuver, Raynelle Fanning, PA-C  albuterol (VENTOLIN HFA) 108 (90 Base) MCG/ACT inhaler Inhale 2 puffs into the lungs every 4 (four) hours as needed for wheezing or shortness of breath. 10/22/19   Dione Booze, MD  fluticasone [facility]) 50 MCG/ACT nasal spray Place 2 sprays into both nostrils daily. 09/08/19   Eber Hong, MD  predniSONE (DELTASONE) 20 MG tablet Take 3 tablets (60 mg total) by mouth daily. 10/22/19   Dione Booze, MD    Allergies    Patient has no known  allergies.  Review of Systems   Review of Systems  Constitutional: Negative for chills and fever.  HENT: Positive for facial swelling. Negative for congestion and sore throat.   Eyes: Negative.   Respiratory: Negative.   Cardiovascular: Negative.   Gastrointestinal: Negative.   Genitourinary: Negative.   Musculoskeletal: Negative for neck pain.  Skin: Positive for color change. Negative for rash and wound.  Neurological: Negative.   Psychiatric/Behavioral: Negative.   All other systems reviewed and are negative.   Physical Exam Updated Vital Signs BP 132/88 (BP Location: Right Arm)   Pulse 77   Temp 97.8 F (36.6 C) (Oral)   Resp 15   Ht 5\' 8"  (1.727 m)   Wt 64.4 kg   SpO2 100%   BMI 21.59 kg/m   Physical Exam Vitals and nursing note reviewed.  Constitutional:      General: He is not in acute distress.    Appearance: Normal appearance. He is well-developed and well-nourished.  HENT:     Head: Normocephalic.     Mouth/Throat:     Dentition: Normal dentition.  Cardiovascular:     Rate and Rhythm: Normal rate.  Pulmonary:     Effort: Pulmonary effort is normal.  Musculoskeletal:        General: No edema. Normal range of motion.     Cervical  back: Neck supple.  Lymphadenopathy:     Head:     Right side of head: Submandibular adenopathy present.  Skin:    Findings: Abscess and erythema present.     Comments: 3 cm raised, erythematous abscess/beard folliculitis right jawline.  No drainage.   Neurological:     Mental Status: He is alert.     ED Results / Procedures / Treatments   Labs (all labs ordered are listed, but only abnormal results are displayed) Labs Reviewed - No data to display  EKG None  Radiology No results found.  Procedures Procedures   INCISION AND DRAINAGE Performed by: Burgess Amor Consent: Verbal consent obtained. Risks and benefits: risks, benefits and alternatives were discussed Type: abscess  Body area: right  jawline  Anesthesia: local infiltration  Incision was made with a scalpel.  Local anesthetic: lidocaine 2% without epinephrine  Anesthetic total: 2 ml  Complexity: complex Blunt dissection to break up loculations  Drainage: purulent  Drainage amount: small  Packing material: none  Patient tolerance: Patient did not tolerate the procedure well. 1 cc of lidocaine injected intradermal without significant improvement in localized pain.  Attempts to locate Gebaur's freeze spray for better toleration unsuccessful - none found in the dept.  He was able to tolerate a very small stab incision using the tip of #11 blade, with modest amount of purulence obtained.  An addition 1 cc of lidocaine was injected through the stab incision for better pain tolerance and for flushing. He was able to tolerate mild expression of additional exudate but moderate swelling persisted.  Discussed that ideally opening the abscess site wider would give better results, but pt unable to tolerate.  He was placed on bactrim, first dose here, advised aggressive warm water soaks, gentle massage, keeping the wound site open.    Return precautions also outlined.    Medications Ordered in ED Medications  sulfamethoxazole-trimethoprim (BACTRIM DS) 800-160 MG per tablet 1 tablet (1 tablet Oral Given 08/29/20 2357)  ibuprofen (ADVIL) tablet 800 mg (800 mg Oral Given 08/29/20 2357)    ED Course  I have reviewed the triage vital signs and the nursing notes.  Pertinent labs & imaging results that were available during my care of the patient were reviewed by me and considered in my medical decision making (see chart for details).    MDM Rules/Calculators/A&P                          Per above Final Clinical Impression(s) / ED Diagnoses Final diagnoses:  Facial abscess    Rx / DC Orders ED Discharge Orders         Ordered    sulfamethoxazole-trimethoprim (BACTRIM DS) 800-160 MG tablet  2 times daily        08/29/20  2354    ibuprofen (ADVIL) 800 MG tablet  3 times daily        08/29/20 2354           Burgess Amor, PA-C 08/30/20 1433    Vanetta Mulders, MD 09/15/20 1626

## 2020-12-16 ENCOUNTER — Encounter (HOSPITAL_COMMUNITY): Payer: Self-pay | Admitting: Emergency Medicine

## 2020-12-16 ENCOUNTER — Emergency Department (HOSPITAL_COMMUNITY)
Admission: EM | Admit: 2020-12-16 | Discharge: 2020-12-16 | Disposition: A | Discharge status: Home / Self Care | Payer: Self-pay | Attending: Emergency Medicine | Admitting: Emergency Medicine

## 2020-12-16 ENCOUNTER — Other Ambulatory Visit: Payer: Self-pay

## 2020-12-16 ENCOUNTER — Emergency Department (HOSPITAL_COMMUNITY): Payer: Self-pay

## 2020-12-16 DIAGNOSIS — R079 Chest pain, unspecified: Secondary | ICD-10-CM | POA: Insufficient documentation

## 2020-12-16 DIAGNOSIS — J45909 Unspecified asthma, uncomplicated: Secondary | ICD-10-CM | POA: Insufficient documentation

## 2020-12-16 DIAGNOSIS — Z87891 Personal history of nicotine dependence: Secondary | ICD-10-CM | POA: Insufficient documentation

## 2020-12-16 LAB — TROPONIN I (HIGH SENSITIVITY)
Troponin I (High Sensitivity): 3 ng/L (ref <18)
Troponin I (High Sensitivity): 3 ng/L (ref <18)

## 2020-12-16 LAB — CBC
HCT: 42.3 % (ref 39.0–52.0)
Hemoglobin: 14.6 g/dL (ref 13.0–17.0)
MCH: 27.1 pg (ref 26.0–34.0)
MCHC: 34.5 g/dL (ref 30.0–36.0)
MCV: 78.6 fL — ABNORMAL LOW (ref 80.0–100.0)
Platelets: 222 10*3/uL (ref 150–400)
RBC: 5.38 MIL/uL (ref 4.22–5.81)
RDW: 13.9 % (ref 11.5–15.5)
WBC: 6.8 10*3/uL (ref 4.0–10.5)
nRBC: 0 % (ref 0.0–0.2)

## 2020-12-16 LAB — BASIC METABOLIC PANEL
Anion gap: 12 (ref 5–15)
BUN: 11 mg/dL (ref 6–20)
CO2: 23 mmol/L (ref 22–32)
Calcium: 9 mg/dL (ref 8.9–10.3)
Chloride: 103 mmol/L (ref 98–111)
Creatinine, Ser: 0.74 mg/dL (ref 0.61–1.24)
GFR, Estimated: >60 mL/min (ref >60)
Glucose, Bld: 80 mg/dL (ref 70–99)
Potassium: 3.7 mmol/L (ref 3.5–5.1)
Sodium: 138 mmol/L (ref 135–145)

## 2020-12-16 MED ORDER — IBUPROFEN 800 MG PO TABS
800.0000 mg | ORAL_TABLET | Freq: Once | ORAL | Status: AC
  Administered 2020-12-16: 09:00:00 800 mg via ORAL
  Filled 2020-12-16: qty 1

## 2020-12-16 NOTE — ED Provider Notes (Signed)
[facility] EMERGENCY DEPARTMENT Provider Note   CSN: [id number] Arrival date & time: 12/16/20  0708     History Chief Complaint  Patient presents with   Chest Pain    Lee Meyer is a 35 y.o. male.  HPI 35 year old male history of asthma presents today complaining of chest pain.  He states that he was going into work this morning.  When he got out of the car he twisted his torso around to lock the door.  At that time he had onset of sharp anterior chest pain felt like a muscle spasm.  It has been continuous since then.  It is worsened with certain movements.  He denies any associated symptoms such as lightheadedness, cough, dyspnea, abdominal pain, nausea, vomiting, fever, or diarrhea.  Has had some similar episodes in the past although not recently.  He states he would not have come in, but work were insistent that he be evaluated.  He has not taken any medications.  He is a smoker but denies any other past medical history or risk factors.    Past Medical History:  Diagnosis Date   Asthma     There are no problems to display for this patient.   History reviewed. No pertinent surgical history.     History reviewed. No pertinent family history.  Social History   Tobacco Use   Smoking status: Former    Packs/day: 0.50    Pack years: 0.00    Types: Cigarettes   Smokeless tobacco: Never  Vaping Use   Vaping Use: Never used  Substance Use Topics   Alcohol use: No    Comment: occ   Drug use: Yes    Types: Marijuana    Home Medications Prior to Admission medications   Medication Sig Start Date End Date Taking? Authorizing Provider  albuterol (VENTOLIN HFA) 108 (90 Base) MCG/ACT inhaler Inhale 2 puffs into the lungs every 4 (four) hours as needed for wheezing or shortness of breath. 10/22/19   Dione Booze, MD  fluticasone [facility]) 50 MCG/ACT nasal spray Place 2 sprays into both nostrils daily. 09/08/19   Eber Hong, MD  ibuprofen (ADVIL) 800 MG  tablet Take 1 tablet (800 mg total) by mouth 3 (three) times daily. 08/29/20   Burgess Amor, PA-C  predniSONE (DELTASONE) 20 MG tablet Take 3 tablets (60 mg total) by mouth daily. 10/22/19   Dione Booze, MD    Allergies    Patient has no known allergies.  Review of Systems   Review of Systems  All other systems reviewed and are negative.  Physical Exam Updated Vital Signs BP 131/90   Pulse 64   Temp 98.8 F (37.1 C) (Oral)   Resp (!) 22   Ht 1.727 m (5\' 8" )   Wt 64.4 kg   SpO2 100%   BMI 21.59 kg/m   Physical Exam Vitals and nursing note reviewed.  Constitutional:      Appearance: He is well-developed.  HENT:     Head: Normocephalic and atraumatic.     Right Ear: External ear normal.     Left Ear: External ear normal.     Nose: Nose normal.  Eyes:     Extraocular Movements: Extraocular movements intact.     Pupils: Pupils are equal, round, and reactive to light.  Neck:     Trachea: No tracheal deviation.  Cardiovascular:     Rate and Rhythm: Normal rate and regular rhythm.     Heart sounds: Normal heart  sounds.     Comments: Pain is somewhat reproduced with movement of trunk. Pulmonary:     Effort: Pulmonary effort is normal.     Breath sounds: Normal breath sounds.  Abdominal:     General: Bowel sounds are normal.     Palpations: Abdomen is soft.  Musculoskeletal:        General: Normal range of motion.     Cervical back: Normal range of motion and neck supple.  Skin:    General: Skin is warm and dry.     Capillary Refill: Capillary refill takes less than 2 seconds.  Neurological:     General: No focal deficit present.     Mental Status: He is alert and oriented to person, place, and time.  Psychiatric:        Mood and Affect: Mood normal.        Behavior: Behavior normal.    ED Results / Procedures / Treatments   Labs (all labs ordered are listed, but only abnormal results are displayed) Labs Reviewed  CBC - Abnormal; Notable for the following  components:      Result Value   MCV 78.6 (*)    All other components within normal limits  BASIC METABOLIC PANEL  TROPONIN I (HIGH SENSITIVITY)    EKG EKG Interpretation  Date/Time:  Thursday December 16 2020 07:14:51 EDT Ventricular Rate:  72 PR Interval:  126 QRS Duration: 96 QT Interval:  360 QTC Calculation: 394 R Axis:   55 Text Interpretation: Normal sinus rhythm Normal ECG No significant change since last tracing No significant change since last tracing Confirmed by Margarita Grizzle [phone]) on 12/16/2020 8:03:03 AM  Radiology DG Chest 2 View  Result Date: 12/16/2020 CLINICAL DATA:  Chest pain. EXAM: CHEST - 2 VIEW COMPARISON:  10/22/2019. FINDINGS: Mediastinum and hilar structures normal. Lungs are clear. No focal infiltrate. No pleural effusion or pneumothorax. Heart size normal. No acute bony abnormality. IMPRESSION: No acute cardiopulmonary disease.  Chest is stable from prior exam. Electronically Signed   By: Maisie Fus  Register   On: 12/16/2020 07:53    Procedures Procedures   Medications Ordered in ED Medications - No data to display  ED Course  I have reviewed the triage vital signs and the nursing notes.  Pertinent labs & imaging results that were available during my care of the patient were reviewed by me and considered in my medical decision making (see chart for details).    MDM Rules/Calculators/A&P                         35 year old male presents today with anterior chest pain that appears to be musculoskeletal in origin.  EKG and chest x-Kensly Bowmer are unchanged from prior.  Patient had troponin and chemistry ordered prior to my evaluation.  These are in process.  However, due to low index of suspicion, if the troponin is negative, we will plan discharge. Labs and troponin negative. Final Clinical Impression(s) / ED Diagnoses Final diagnoses:  Chest pain, unspecified type    Rx / DC Orders ED Discharge Orders     None        Margarita Grizzle, MD 12/16/20 1019

## 2020-12-16 NOTE — ED Triage Notes (Signed)
Patient complains of sudden onset of left sided chest tightness that he noticed while at work this morning at approximately 0500 when he twisted his torso to reach for something. Patient reports similar incident in the past after making a similar movement. Patient alert, oriented, and in no apparent distress at this time.

## 2021-06-21 ENCOUNTER — Emergency Department (HOSPITAL_COMMUNITY): Payer: Self-pay

## 2021-06-21 ENCOUNTER — Emergency Department (HOSPITAL_COMMUNITY)
Admission: EM | Admit: 2021-06-21 | Discharge: 2021-06-21 | Disposition: A | Discharge status: Home / Self Care | Payer: Self-pay | Attending: Emergency Medicine | Admitting: Emergency Medicine

## 2021-06-21 ENCOUNTER — Other Ambulatory Visit: Payer: Self-pay

## 2021-06-21 ENCOUNTER — Encounter (HOSPITAL_COMMUNITY): Payer: Self-pay | Admitting: Emergency Medicine

## 2021-06-21 DIAGNOSIS — Z7951 Long term (current) use of inhaled steroids: Secondary | ICD-10-CM | POA: Insufficient documentation

## 2021-06-21 DIAGNOSIS — J189 Pneumonia, unspecified organism: Secondary | ICD-10-CM

## 2021-06-21 DIAGNOSIS — Z20822 Contact with and (suspected) exposure to covid-19: Secondary | ICD-10-CM | POA: Insufficient documentation

## 2021-06-21 DIAGNOSIS — Z87891 Personal history of nicotine dependence: Secondary | ICD-10-CM | POA: Insufficient documentation

## 2021-06-21 DIAGNOSIS — R0602 Shortness of breath: Secondary | ICD-10-CM | POA: Insufficient documentation

## 2021-06-21 LAB — RESP PANEL BY RT-PCR (FLU A&B, COVID) ARPGX2
Influenza A by PCR: NEGATIVE
Influenza B by PCR: NEGATIVE
SARS Coronavirus 2 by RT PCR: NEGATIVE

## 2021-06-21 MED ORDER — DOXYCYCLINE HYCLATE 100 MG PO CAPS: 100.0000 mg | ORAL_CAPSULE | Freq: Two times a day (BID) | ORAL | 0 refills | Status: DC

## 2021-06-21 MED ORDER — ALBUTEROL SULFATE HFA 108 (90 BASE) MCG/ACT IN AERS
2.0000 | INHALATION_SPRAY | Freq: Once | RESPIRATORY_TRACT | Status: AC
  Administered 2021-06-21: 2 via RESPIRATORY_TRACT
  Filled 2021-06-21: qty 6.7

## 2021-06-21 NOTE — Discharge Instructions (Signed)
You have been seen and discharged from the emergency department.  You are diagnosed with a developing pneumonia and wheezing.  Use inhaler as prescribed.  Take antibiotic as directed.  Use Tylenol/ibuprofen as needed for pain or fever control.  Stay well-hydrated.  Your flu and COVID swab has been sent and is pending.  Follow-up with your primary provider for reevaluation and further care. Take home medications as prescribed. If you have any worsening symptoms or further concerns for your health please return to an emergency department for further evaluation.

## 2021-06-21 NOTE — ED Provider Notes (Addendum)
[facility] EMERGENCY DEPARTMENT Provider Note   CSN: [id number] Arrival date & time: 06/21/21  0701     History Chief Complaint  Patient presents with   Shortness of Breath    Lee Meyer is a 35 y.o. male.  HPI  35 year old male with past medical history of asthma presents emergency department with intermittent shortness of breath for the past 2 days.  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  He states going from the intense cold to hot sometimes makes him short of breath.  He feels like he wheezes.  He has an intermittently productive cough of clear phlegm.  Denies any fever.  No chest pain or back pain.  No swelling of his lower extremities.  No history of DVT/PE.  He states that he is supposed to use a home inhaler but currently does not have 1.  Past Medical History:  Diagnosis Date   Asthma     There are no problems to display for this patient.   No past surgical history on file.     No family history on file.  Social History   Tobacco Use   Smoking status: Former    Packs/day: 0.50    Types: Cigarettes   Smokeless tobacco: Never  Vaping Use   Vaping Use: Never used  Substance Use Topics   Alcohol use: No    Comment: occ   Drug use: Yes    Types: Marijuana    Home Medications Prior to Admission medications   Medication Sig Start Date End Date Taking? Authorizing Provider  albuterol (VENTOLIN HFA) 108 (90 Base) MCG/ACT inhaler Inhale 2 puffs into the lungs every 4 (four) hours as needed for wheezing or shortness of breath. Patient not taking: Reported on 0000000 Q000111Q   Delora Fuel, MD  fluticasone [facility]) 50 MCG/ACT nasal spray Place 2 sprays into both nostrils daily. Patient not taking: Reported on 06/21/2021 09/08/19   Noemi Chapel, MD  ibuprofen (ADVIL) 800 MG tablet Take 1 tablet (800 mg total) by mouth 3 (three) times daily. Patient not taking: Reported on 06/21/2021 08/29/20   Evalee Jefferson, PA-C  predniSONE (DELTASONE) 20 MG tablet  Take 3 tablets (60 mg total) by mouth daily. Patient not taking: Reported on 0000000 Q000111Q   Delora Fuel, MD    Allergies    Patient has no known allergies.  Review of Systems   Review of Systems  Constitutional:  Negative for chills and fever.  HENT:  Negative for congestion.   Eyes:  Negative for visual disturbance.  Respiratory:  Positive for cough and wheezing. Negative for chest tightness and shortness of breath.   Cardiovascular:  Negative for chest pain, palpitations and leg swelling.  Gastrointestinal:  Negative for abdominal pain, diarrhea and vomiting.  Genitourinary:  Negative for dysuria.  Skin:  Negative for rash.  Neurological:  Negative for headaches.   Physical Exam Updated Vital Signs BP (!) 142/88   Pulse 93   Temp 97.8 F (36.6 C) (Oral)   Resp 17   Ht 5\' 8"  (1.727 m)   Wt 66.7 kg   SpO2 98%   BMI 22.35 kg/m   Physical Exam Vitals and nursing note reviewed.  Constitutional:      General: He is not in acute distress.    Appearance: Normal appearance.  HENT:     Head: Normocephalic.     Mouth/Throat:     Mouth: Mucous membranes are moist.  Cardiovascular:     Rate and Rhythm: Normal  rate.  Pulmonary:     Effort: Pulmonary effort is normal. No tachypnea, accessory muscle usage or respiratory distress.     Breath sounds: Examination of the right-middle field reveals wheezing. Examination of the left-middle field reveals wheezing. Wheezing present.  Abdominal:     Palpations: Abdomen is soft.     Tenderness: There is no abdominal tenderness.  Skin:    General: Skin is warm.  Neurological:     Mental Status: He is alert and oriented to person, place, and time. Mental status is at baseline.  Psychiatric:        Mood and Affect: Mood normal.    ED Results / Procedures / Treatments   Labs (all labs ordered are listed, but only abnormal results are displayed) Labs Reviewed - No data to display  EKG None  Radiology DG Chest 2  View  Result Date: 06/21/2021 CLINICAL DATA:  Chest pain and shortness of breath for 2 days. EXAM: CHEST - 2 VIEW COMPARISON:  12/16/2020 FINDINGS: The cardiac silhouette, mediastinal and hilar contours are within normal limits. Patchy right upper lobe airspace opacity suspicious for early or developing pneumonia. No pulmonary lesions or pleural effusions. The bony thorax is intact. IMPRESSION: Right upper lobe infiltrate. Electronically Signed   By: Rudie Meyer M.D.   On: 06/21/2021 08:27    Procedures Procedures   Medications Ordered in ED Medications  albuterol (VENTOLIN HFA) 108 (90 Base) MCG/ACT inhaler 2 puff (2 puffs Inhalation Given 06/21/21 1740)    ED Course  I have reviewed the triage vital signs and the nursing notes.  Pertinent labs & imaging results that were available during my care of the patient were reviewed by me and considered in my medical decision making (see chart for details).    MDM Rules/Calculators/A&P                           35 year old male presents emergency department intermittent shortness of breath.  Specifically when going from freezer to hot temperatures.  Vital signs are stable on arrival.  He is PERC negative.  Very well-appearing.  Scattered wheezes on lung exam.  Chest x-ray shows a right upper lobe infiltrate, concerning for developing pneumonia.  No known sick contacts, he is afebrile.  Flu and COVID swab will be sent.  Plan to treat symptomatically with inhaler and antibiotics with outpatient follow-up.  Patient at this time appears safe and stable for discharge and will be treated as an outpatient.  Discharge plan and strict return to ED precautions discussed, patient verbalizes understanding and agreement.  Final Clinical Impression(s) / ED Diagnoses Final diagnoses:  None    Rx / DC Orders ED Discharge Orders     None        Rozelle Logan, DO 06/21/21 0842    Rozelle Logan, DO 06/21/21 [phone]

## 2021-06-21 NOTE — ED Triage Notes (Signed)
Pt c/o SOB x 2 days  

## 2021-11-15 ENCOUNTER — Emergency Department (HOSPITAL_COMMUNITY)
Admission: EM | Admit: 2021-11-15 | Discharge: 2021-11-15 | Disposition: A | Discharge status: Home / Self Care | Payer: Self-pay | Attending: Emergency Medicine | Admitting: Emergency Medicine

## 2021-11-15 ENCOUNTER — Encounter (HOSPITAL_COMMUNITY): Payer: Self-pay | Admitting: Emergency Medicine

## 2021-11-15 ENCOUNTER — Other Ambulatory Visit: Payer: Self-pay

## 2021-11-15 DIAGNOSIS — J4541 Moderate persistent asthma with (acute) exacerbation: Secondary | ICD-10-CM | POA: Insufficient documentation

## 2021-11-15 MED ORDER — PREDNISONE 50 MG PO TABS: ORAL_TABLET | ORAL | 0 refills | Status: DC

## 2021-11-15 MED ORDER — ALBUTEROL SULFATE (2.5 MG/3ML) 0.083% IN NEBU
5.0000 mg | INHALATION_SOLUTION | Freq: Once | RESPIRATORY_TRACT | Status: AC
  Administered 2021-11-15: 5 mg via RESPIRATORY_TRACT
  Filled 2021-11-15: qty 6

## 2021-11-15 MED ORDER — PREDNISONE 10 MG PO TABS
60.0000 mg | ORAL_TABLET | Freq: Once | ORAL | Status: AC
  Administered 2021-11-15: 60 mg via ORAL
  Filled 2021-11-15: qty 1

## 2021-11-15 MED ORDER — IPRATROPIUM BROMIDE 0.02 % IN SOLN
0.5000 mg | Freq: Once | RESPIRATORY_TRACT | Status: AC
  Administered 2021-11-15: 0.5 mg via RESPIRATORY_TRACT
  Filled 2021-11-15: qty 2.5

## 2021-11-15 MED ORDER — ALBUTEROL SULFATE HFA 108 (90 BASE) MCG/ACT IN AERS
2.0000 | INHALATION_SPRAY | Freq: Once | RESPIRATORY_TRACT | Status: AC
  Administered 2021-11-15: 2 via RESPIRATORY_TRACT
  Filled 2021-11-15: qty 6.7

## 2021-11-15 NOTE — ED Triage Notes (Signed)
Pt c/o sob x one hour. 

## 2021-11-15 NOTE — ED Provider Notes (Signed)
?[facility] ?Provider Note ? ? ?CSN: [id number] ?Arrival date & time: 11/15/21  0257 ? ?  ? ?History ? ?Chief Complaint  ?Patient presents with  ? Shortness of Breath  ? ? ?Lee Meyer is a 36 y.o. male. ? ?The history is provided by the patient.  ?Shortness of Breath ?Severity:  Moderate ?Onset quality:  Gradual ?Duration:  3 days ?Timing:  Intermittent ?Progression:  Worsening ?Chronicity:  Recurrent ?Relieved by:  Nothing ?Worsened by:  Nothing ?Associated symptoms: cough and wheezing   ?Associated symptoms: no chest pain, no fever and no hemoptysis   ?Patient history of asthma presents with his increasing cough, shortness of breath and wheezing over the past 3 days.  No fevers or vomiting.  No chest pain.  No hemoptysis ?He has run out of his medications ?  ? ?Home Medications ?Prior to Admission medications   ?Not on File  ?   ? ?Allergies    ?Patient has no known allergies.   ? ?Review of Systems   ?Review of Systems  ?Constitutional:  Negative for fever.  ?Respiratory:  Positive for cough, shortness of breath and wheezing. Negative for hemoptysis.   ?Cardiovascular:  Negative for chest pain.  ? ?Physical Exam ?Updated Vital Signs ?BP (!) 132/94 (BP Location: Left Arm)   Pulse 97   Temp 98 ?F (36.7 ?C) (Oral)   Resp 20   Ht 1.727 m (5\' 8" )   Wt 66.7 kg   SpO2 100%   BMI 22.35 kg/m?  ?Physical Exam ?CONSTITUTIONAL: Well developed/well nourished ?HEAD: Normocephalic/atraumatic ?EYES: EOMI ?ENMT: Mucous membranes moist ?NECK: supple no meningeal signs ?CV: S1/S2 noted, no murmurs/rubs/gallops noted ?LUNGS: Wheezing bilaterally, mild tachypnea no ?ABDOMEN: soft ?NEURO: Pt is awake/alert/appropriate, moves all extremitiesx4.  No facial droop.   ?EXTREMITIES: pulses normal/equal, full ROM, no lower extremity edema ?SKIN: warm, color normal ?PSYCH: no abnormalities of mood noted, alert and oriented to situation ? ?ED Results / Procedures / Treatments   ?Labs ?(all labs ordered are listed,  but only abnormal results are displayed) ?Labs Reviewed - No data to display ? ?EKG ?None ? ?Radiology ?No results found. ? ?Procedures ?Procedures  ? ? ?Medications Ordered in ED ?Medications  ?albuterol (VENTOLIN HFA) 108 (90 Base) MCG/ACT inhaler 2 puff (has no administration in time range)  ?albuterol (PROVENTIL) (2.5 MG/3ML) 0.083% nebulizer solution 5 mg (5 mg Nebulization Given 11/15/21 0324)  ?ipratropium (ATROVENT) nebulizer solution 0.5 mg (0.5 mg Nebulization Given 11/15/21 0324)  ?predniSONE (DELTASONE) tablet 60 mg (60 mg Oral Given 11/15/21 0322)  ? ? ?ED Course/ Medical Decision Making/ A&P ?Clinical Course as of 11/15/21 0404  ?Tue Nov 15, 2021  ?0403 Patient improved.  Minimal wheeze noted on repeat assessment.  He is safe for discharge home.  Will provide albuterol discharge.  Will place on prednisone for the next 4 days [DW]  ?  ?Clinical Course User Index ?[DW] Nov 17, 2021, MD  ? ?                        ?Medical Decision Making ?Risk ?Prescription drug management. ? ? ?This patient presents to the ED for concern of shortness of breath, this involves an extensive number of treatment options, and is a complaint that carries with it a high risk of complications and morbidity.  The differential diagnosis includes but is not limited to asthma exacerbation, CHF exacerbation, pneumonia ? ?Comorbidities that complicate the patient evaluation: ?Patient?s presentation is complicated by their  history of asthma ? ?Social Determinants of Health: ?Patient?s lack of prescription access  increases the complexity of managing their presentation ? ? ?Medicines ordered and prescription drug management: ?I ordered medication including albuterol/Atrovent/prednisone for wheezing ?Reevaluation of the patient after these medicines showed that the patient    improved ? ? ?Reevaluation: ?After the interventions noted above, I reevaluated the patient and found that they have :improved ? ?Complexity of problems  addressed: ?Patient?s presentation is most consistent with  acute presentation with potential threat to life or bodily function ? ?Disposition: ?After consideration of the diagnostic results and the patient?s response to treatment,  ?I feel that the patent would benefit from discharge   .  ? ? ? ? ? ? ? ? ? ?Final Clinical Impression(s) / ED Diagnoses ?Final diagnoses:  ?Moderate persistent asthma with exacerbation  ? ? ?Rx / DC Orders ?ED Discharge Orders   ? ?      Ordered  ?  predniSONE (DELTASONE) 50 MG tablet       ? 11/15/21 0402  ? ?  ?  ? ?  ? ? ?  ?Zadie Rhine, MD ?11/15/21 0404 ? ?

## 2022-02-28 ENCOUNTER — Emergency Department (HOSPITAL_COMMUNITY)
Admission: EM | Admit: 2022-02-28 | Discharge: 2022-02-28 | Disposition: A | Discharge status: Home / Self Care | Payer: Self-pay | Attending: Emergency Medicine | Admitting: Emergency Medicine

## 2022-02-28 ENCOUNTER — Other Ambulatory Visit: Payer: Self-pay

## 2022-02-28 ENCOUNTER — Encounter (HOSPITAL_COMMUNITY): Payer: Self-pay

## 2022-02-28 DIAGNOSIS — Z87891 Personal history of nicotine dependence: Secondary | ICD-10-CM | POA: Insufficient documentation

## 2022-02-28 DIAGNOSIS — J45909 Unspecified asthma, uncomplicated: Secondary | ICD-10-CM | POA: Insufficient documentation

## 2022-02-28 DIAGNOSIS — Z708 Other sex counseling: Secondary | ICD-10-CM | POA: Insufficient documentation

## 2022-02-28 DIAGNOSIS — Z0189 Encounter for other specified special examinations: Secondary | ICD-10-CM

## 2022-02-28 DIAGNOSIS — R3989 Other symptoms and signs involving the genitourinary system: Secondary | ICD-10-CM | POA: Insufficient documentation

## 2022-02-28 DIAGNOSIS — N368 Other specified disorders of urethra: Secondary | ICD-10-CM

## 2022-02-28 LAB — URINALYSIS, ROUTINE W REFLEX MICROSCOPIC
Bacteria, UA: NONE SEEN
Bilirubin Urine: NEGATIVE
Glucose, UA: NEGATIVE mg/dL
Hgb urine dipstick: NEGATIVE
Ketones, ur: 5 mg/dL — AB
Nitrite: NEGATIVE
Protein, ur: 30 mg/dL — AB
Specific Gravity, Urine: 1.027 (ref 1.005–1.030)
pH: 6 (ref 5.0–8.0)

## 2022-02-28 MED ORDER — CEFTRIAXONE SODIUM 500 MG IJ SOLR
500.0000 mg | Freq: Once | INTRAMUSCULAR | Status: AC
  Administered 2022-02-28: 500 mg via INTRAMUSCULAR
  Filled 2022-02-28: qty 500

## 2022-02-28 MED ORDER — LIDOCAINE HCL (PF) 1 % IJ SOLN
2.0000 mL | Freq: Once | INTRAMUSCULAR | Status: AC
  Administered 2022-02-28: 2 mL via INTRADERMAL
  Filled 2022-02-28: qty 5

## 2022-02-28 MED ORDER — DOXYCYCLINE HYCLATE 100 MG PO CAPS: 100.0000 mg | ORAL_CAPSULE | Freq: Two times a day (BID) | ORAL | 0 refills | Status: DC

## 2022-02-28 NOTE — ED Triage Notes (Signed)
Pt wants STD check, states his penis does not feel normal, denies burning with urination.

## 2022-02-28 NOTE — ED Provider Notes (Signed)
[facility] EMERGENCY DEPARTMENT Provider Note  CSN: [id number] Arrival date & time: 02/28/22 1522  Chief Complaint(s) SEXUALLY TRANSMITTED DISEASE  HPI Lee Meyer is a 36 y.o. male with history of asthma presenting to the emergency department with urethral discomfort.  Reports mild constant tingling pain, worse with urination.  Denies any urethral discharge.  Denies testicular pain.  Denies any lesions.  Denies fevers or chills, abdominal pain.  Reports his symptoms are mild.  Does have new sexual partner, male, not using protection.  Requesting STD testing.  similar symptoms to when he had an STD previously   Past Medical History Past Medical History:  Diagnosis Date   Asthma    There are no problems to display for this patient.  Home Medication(s) Prior to Admission medications   Medication Sig Start Date End Date Taking? Authorizing Provider  doxycycline (VIBRAMYCIN) 100 MG capsule Take 1 capsule (100 mg total) by mouth 2 (two) times daily. 02/28/22  Yes Lonell Grandchild, MD  predniSONE (DELTASONE) 50 MG tablet 1 tablet PO QD X4 days 11/15/21   Zadie Rhine, MD                                                                                                                                    Past Surgical History History reviewed. No pertinent surgical history. Family History History reviewed. No pertinent family history.  Social History Social History   Tobacco Use   Smoking status: Former    Packs/day: 0.50    Types: Cigarettes   Smokeless tobacco: Never  Vaping Use   Vaping Use: Never used  Substance Use Topics   Alcohol use: No    Comment: occ   Drug use: Yes    Types: Marijuana   Allergies Patient has no known allergies.  Review of Systems Review of Systems  All other systems reviewed and are negative.   Physical Exam Vital Signs  I have reviewed the triage vital signs BP 125/86 (BP Location: Right Arm)   Pulse 78   Temp 98.5 F (36.9 C)  (Oral)   Resp 20   Ht 5\' 8"  (1.727 m)   Wt 61 kg   SpO2 99%   BMI 20.44 kg/m  Physical Exam Vitals and nursing note reviewed.  Constitutional:      Appearance: Normal appearance.  HENT:     Head: Normocephalic and atraumatic.     Right Ear: External ear normal.     Left Ear: External ear normal.     Nose: Nose normal.     Mouth/Throat:     Mouth: Mucous membranes are moist.  Eyes:     Conjunctiva/sclera: Conjunctivae normal.  Cardiovascular:     Rate and Rhythm: Normal rate.  Pulmonary:     Effort: Pulmonary effort is normal. No respiratory distress.  Abdominal:     General: Abdomen is flat.     Palpations: Abdomen is soft.  Tenderness: There is no abdominal tenderness.  Musculoskeletal:     Cervical back: Neck supple.     Right lower leg: No edema.     Left lower leg: No edema.  Skin:    General: Skin is warm and dry.     Capillary Refill: Capillary refill takes less than 2 seconds.  Neurological:     General: No focal deficit present.     Mental Status: He is alert and oriented to person, place, and time.  Psychiatric:        Mood and Affect: Mood normal.        Behavior: Behavior normal.     ED Results and Treatments Labs (all labs ordered are listed, but only abnormal results are displayed) Labs Reviewed  URINALYSIS, ROUTINE W REFLEX MICROSCOPIC  GC/CHLAMYDIA PROBE AMP ([place]) NOT AT [facility]                                                                                                                          Radiology No results found.  Pertinent labs & imaging results that were available during my care of the patient were reviewed by me and considered in my medical decision making (see MDM for details).  Medications Ordered in ED Medications  cefTRIAXone (ROCEPHIN) injection 500 mg (has no administration in time range)  lidocaine (PF) (XYLOCAINE) 1 % injection 2 mL (has no administration in time range)                                                                                                                                      Procedures Procedures  (including critical care time)  Medical Decision Making / ED Course   MDM:  36 year old male presenting to the emergency department with quest for STD testing and urethral discomfort.  Patient well-appearing, vital signs reassuring.  Patient declined GU exam.  Denies any testicular pain or lesions.  Given symptoms, will obtain STD testing and urinalysis.  We will treat empirically for Gonorrhea and chlamydia will discharge to home.  Will provide strict return precautions for development of testicular pain or any other new or worsening symptoms.    Lab Tests: -I ordered, reviewed, and interpreted labs.   The pertinent results include:   Labs Reviewed  URINALYSIS, ROUTINE W REFLEX MICROSCOPIC  GC/CHLAMYDIA PROBE AMP () NOT AT [facility]  Medicines ordered and prescription drug management: Meds ordered this encounter  Medications   cefTRIAXone (ROCEPHIN) injection 500 mg    Order Specific Question:   Antibiotic Indication:    Answer:   STD   lidocaine (PF) (XYLOCAINE) 1 % injection 2 mL   doxycycline (VIBRAMYCIN) 100 MG capsule    Sig: Take 1 capsule (100 mg total) by mouth 2 (two) times daily.    Dispense:  20 capsule    Refill:  0    -I have reviewed the patients home medicines and have made adjustments as needed   Social Determinants of Health:  Factors impacting patients care include: unsafe sex practices   Reevaluation: After the interventions noted above, I reevaluated the patient and found that they have :stayed the same  Co morbidities that complicate the patient evaluation  Past Medical History:  Diagnosis Date   Asthma       Dispostion: Discharge     Final Clinical Impression(s) / ED Diagnoses Final diagnoses:  Urethral irritation  Encounter for sexually transmitted disease counseling  Encounter for laboratory test      This chart was dictated using voice recognition software.  Despite best efforts to proofread,  errors can occur which can change the documentation meaning.    Lonell Grandchild, MD 02/28/22 [phone]

## 2022-03-01 LAB — GC/CHLAMYDIA PROBE AMP (~~LOC~~) NOT AT ARMC
Chlamydia: NEGATIVE
Comment: NEGATIVE
Comment: NORMAL
Neisseria Gonorrhea: NEGATIVE

## 2022-03-07 ENCOUNTER — Emergency Department (HOSPITAL_COMMUNITY)
Admission: EM | Admit: 2022-03-07 | Discharge: 2022-03-07 | Disposition: A | Discharge status: Home / Self Care | Payer: Self-pay | Attending: Emergency Medicine | Admitting: Emergency Medicine

## 2022-03-07 ENCOUNTER — Other Ambulatory Visit: Payer: Self-pay

## 2022-03-07 ENCOUNTER — Encounter (HOSPITAL_COMMUNITY): Payer: Self-pay | Admitting: Emergency Medicine

## 2022-03-07 DIAGNOSIS — R300 Dysuria: Secondary | ICD-10-CM | POA: Insufficient documentation

## 2022-03-07 NOTE — ED Provider Notes (Signed)
  [facility] EMERGENCY DEPARTMENT Provider Note   CSN: [id number] Arrival date & time: 03/07/22  9379     History  Chief Complaint  Patient presents with   SEXUALLY TRANSMITTED DISEASE    Lee Meyer is a 36 y.o. male.  HPI Patient presents for reevaluation for urethral irritation.  Patient was seen on August 22 for possible STD.  He was given Rocephin and doxycycline and testing was sent.  Gonorrhea and Chlamydia testing were both negative. However he reports his symptoms continue.  He has been taking doxycycline as prescribed.  He reports continued "tingling "after urination.  No fevers or vomiting.  No penile discharge or lesions.  He is able to fully urinate. He has not had any further sexual activity since last ER visit    Home Medications Prior to Admission medications   Medication Sig Start Date End Date Taking? Authorizing Provider  doxycycline (VIBRAMYCIN) 100 MG capsule Take 1 capsule (100 mg total) by mouth 2 (two) times daily. 02/28/22   Lonell Grandchild, MD      Allergies    Patient has no known allergies.    Review of Systems   Review of Systems  Physical Exam Updated Vital Signs BP 117/83   Pulse 66   Temp 98.7 F (37.1 C)   Resp 15   Ht 1.727 m (5\' 8" )   Wt 61 kg   SpO2 97%   BMI 20.45 kg/m  Physical Exam CONSTITUTIONAL: Well developed/well nourished HEAD: Normocephalic/atraumatic EYES: EOMI GU: Chaperone present for exam-no penile lesions or discharge, no scrotal tenderness or testicular tenderness NEURO: Pt is awake/alert/appropriate, moves all extremitiesx4.    ED Results / Procedures / Treatments   Labs (all labs ordered are listed, but only abnormal results are displayed) Labs Reviewed  URINE CULTURE  GC/CHLAMYDIA PROBE AMP ([place]) NOT AT [facility]    EKG None  Radiology No results found.  Procedures Procedures    Medications Ordered in ED Medications - No data to display  ED Course/ Medical Decision Making/ A&P                            Medical Decision Making Amount and/or Complexity of Data Reviewed Labs: ordered.   We will send urine culture and another round of GC chlamydia testing.  Will refer to urology if symptoms do not improve after doxycycline.        Final Clinical Impression(s) / ED Diagnoses Final diagnoses:  Dysuria    Rx / DC Orders ED Discharge Orders     None         [facility], MD 03/07/22 [phone]

## 2022-03-07 NOTE — ED Triage Notes (Signed)
Pt states he was checked last week for std and it was negative. Pt has been taking doxycyline as directed but still has tingling of penis after he urinates. Pt denies any discharge.

## 2022-03-08 LAB — GC/CHLAMYDIA PROBE AMP (~~LOC~~) NOT AT ARMC
Chlamydia: NEGATIVE
Comment: NEGATIVE
Comment: NORMAL
Neisseria Gonorrhea: NEGATIVE

## 2022-03-08 LAB — URINE CULTURE: Culture: NO GROWTH

## 2022-04-05 ENCOUNTER — Encounter (HOSPITAL_COMMUNITY): Payer: Self-pay | Admitting: *Deleted

## 2022-04-05 ENCOUNTER — Other Ambulatory Visit: Payer: Self-pay

## 2022-04-05 ENCOUNTER — Emergency Department (HOSPITAL_COMMUNITY)
Admission: EM | Admit: 2022-04-05 | Discharge: 2022-04-05 | Disposition: A | Discharge status: Home / Self Care | Payer: 59 | Attending: Emergency Medicine | Admitting: Emergency Medicine

## 2022-04-05 ENCOUNTER — Emergency Department (HOSPITAL_COMMUNITY): Payer: 59

## 2022-04-05 DIAGNOSIS — J4541 Moderate persistent asthma with (acute) exacerbation: Secondary | ICD-10-CM | POA: Diagnosis not present

## 2022-04-05 DIAGNOSIS — R0602 Shortness of breath: Secondary | ICD-10-CM | POA: Diagnosis not present

## 2022-04-05 DIAGNOSIS — J45901 Unspecified asthma with (acute) exacerbation: Secondary | ICD-10-CM | POA: Diagnosis not present

## 2022-04-05 MED ORDER — ALBUTEROL SULFATE HFA 108 (90 BASE) MCG/ACT IN AERS
INHALATION_SPRAY | RESPIRATORY_TRACT | Status: AC
  Filled 2022-04-05: qty 6.7

## 2022-04-05 MED ORDER — PREDNISONE 50 MG PO TABS: 50.0000 mg | ORAL_TABLET | Freq: Every day | ORAL | 0 refills | Status: AC

## 2022-04-05 MED ORDER — METHYLPREDNISOLONE SODIUM SUCC 125 MG IJ SOLR
125.0000 mg | Freq: Once | INTRAMUSCULAR | Status: AC
  Administered 2022-04-05: 125 mg via INTRAVENOUS
  Filled 2022-04-05: qty 2

## 2022-04-05 MED ORDER — ALBUTEROL SULFATE (2.5 MG/3ML) 0.083% IN NEBU
10.0000 mg/h | INHALATION_SOLUTION | RESPIRATORY_TRACT | Status: AC
  Administered 2022-04-05: 10 mg/h via RESPIRATORY_TRACT

## 2022-04-05 MED ORDER — ALBUTEROL SULFATE (2.5 MG/3ML) 0.083% IN NEBU
2.5000 mg | INHALATION_SOLUTION | RESPIRATORY_TRACT | Status: DC | PRN
  Filled 2022-04-05: qty 3

## 2022-04-05 MED ORDER — IPRATROPIUM-ALBUTEROL 0.5-2.5 (3) MG/3ML IN SOLN
3.0000 mL | Freq: Once | RESPIRATORY_TRACT | Status: AC
  Administered 2022-04-05: 3 mL via RESPIRATORY_TRACT
  Filled 2022-04-05: qty 3

## 2022-04-05 MED ORDER — ALBUTEROL SULFATE HFA 108 (90 BASE) MCG/ACT IN AERS
2.0000 | INHALATION_SPRAY | RESPIRATORY_TRACT | Status: DC | PRN
Start: 2022-04-05 — End: 2022-04-05

## 2022-04-05 NOTE — Discharge Instructions (Signed)
Take your next dose of prednisone tomorrow evening.  Use your inhaler as needed taking 2 puffs every 4 hours if you are wheezing or short of breath.  I have given you some referrals for obtaining a primary medical doctor.  In the interim return here if you have any return of your asthma symptoms.

## 2022-04-05 NOTE — ED Notes (Signed)
RT at bedside administering Neb Trx

## 2022-04-05 NOTE — ED Triage Notes (Signed)
Pt with hx of asthma, c/o sob since last night.  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  Pt has ran out of his inhaler . No acute distress at this time.

## 2022-04-09 NOTE — ED Provider Notes (Signed)
[facility] EMERGENCY DEPARTMENT Provider Note   CSN: [id number] Arrival date & time: 04/05/22  1950     History  Chief Complaint  Patient presents with   Asthma    Lee Meyer is a 36 y.o. male.  The history is provided by the patient.  Asthma This is a recurrent problem. The current episode started yesterday (wheezing and SOB which started last night while at work.  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  He has run out of his albuterol MDI and has had no treatment prior to arrival.). Associated symptoms include shortness of breath. Pertinent negatives include no chest pain and no abdominal pain. Associated symptoms comments: Patient denies fevers, chest pain, palpitations, orthopnea, peripheral edema.  He has had a cough which has been nonproductive.. The symptoms are aggravated by exertion. The symptoms are relieved by rest. He has tried nothing for the symptoms.       Home Medications Prior to Admission medications   Medication Sig Start Date End Date Taking? Authorizing Provider  predniSONE (DELTASONE) 50 MG tablet Take 1 tablet (50 mg total) by mouth daily for 4 days. 04/05/22 04/09/22 Yes Wayden Schwertner, Raynelle Fanning, PA-C  doxycycline (VIBRAMYCIN) 100 MG capsule Take 1 capsule (100 mg total) by mouth 2 (two) times daily. 02/28/22   Lonell Grandchild, MD      Allergies    Patient has no known allergies.    Review of Systems   Review of Systems  Constitutional:  Negative for chills and fever.  Respiratory:  Positive for cough, shortness of breath and wheezing.   Cardiovascular:  Negative for chest pain, palpitations and leg swelling.  Gastrointestinal:  Negative for abdominal pain.  All other systems reviewed and are negative.   Physical Exam Updated Vital Signs BP 137/84 (BP Location: Right Arm)   Pulse 89   Temp 98.2 F (36.8 C) (Oral)   Resp 20   Ht 5\' 8"  (1.727 m)   Wt 63.5 kg   SpO2 100%   BMI 21.29  kg/m  Physical Exam Vitals and nursing note reviewed.  Constitutional:      Appearance: He is well-developed.  HENT:     Head: Normocephalic and atraumatic.  Eyes:     Conjunctiva/sclera: Conjunctivae normal.  Cardiovascular:     Rate and Rhythm: Normal rate and regular rhythm.     Heart sounds: Normal heart sounds.  Pulmonary:     Effort: Pulmonary effort is normal.     Breath sounds: Wheezing present. No rhonchi or rales.     Comments: Expiratory wheeze with prolonged expirations. Chest:     Chest wall: No tenderness.  Abdominal:     General: Bowel sounds are normal.     Palpations: Abdomen is soft.     Tenderness: There is no abdominal tenderness.  Musculoskeletal:        General: Normal range of motion.     Cervical back: Normal range of motion.     Right lower leg: No edema.     Left lower leg: No edema.  Skin:    General: Skin is warm and dry.  Neurological:     Mental Status: He is alert.     ED Results / Procedures / Treatments   Labs (all labs ordered are listed, but only abnormal results are displayed) Labs Reviewed - No data to display  EKG None  Radiology   DG Chest  2 View  Result Date: 04/05/2022 CLINICAL DATA:  Shortness of breath EXAM: CHEST - 2 VIEW COMPARISON:  06/21/2021 FINDINGS: The heart size and mediastinal contours are within normal limits. Both lungs are clear. The visualized skeletal structures are unremarkable. IMPRESSION: No active cardiopulmonary disease. Electronically Signed   By: Donavan Foil M.D.   On: 04/05/2022 20:56    Procedures Procedures    Medications Ordered in ED Medications  albuterol (PROVENTIL) (2.5 MG/3ML) 0.083% nebulizer solution (10 mg/hr Nebulization Not Given 04/05/22 2354)  ipratropium-albuterol (DUONEB) 0.5-2.5 (3) MG/3ML nebulizer solution 3 mL (3 mLs Nebulization Given 04/05/22 2130)  methylPREDNISolone sodium succinate (SOLU-MEDROL) 125 mg/2 mL injection 125 mg (125 mg Intravenous Given 04/05/22 2157)   albuterol (VENTOLIN HFA) 108 (90 Base) MCG/ACT inhaler (  Given 04/05/22 2201)    ED Course/ Medical Decision Making/ A&P                           Medical Decision Making Patient presented with significant wheezing and shortness of breath.  He was being given an albuterol and Atrovent nebulizer treatment during my initial evaluation.  He did have significant expiratory wheeze during the initial exam, after the treatment was completed his wheezing had completely resolved and he was symptom-free.  He was given a dose of Solu-Medrol.  He was also given albuterol MDI for home use along with a prednisone pulse dose.  Strict return precautions were outlined.  Patient felt well and was symptom-free at time of discharge.  Amount and/or Complexity of Data Reviewed Radiology: ordered.  Risk Prescription drug management.           Final Clinical Impression(s) / ED Diagnoses Final diagnoses:  Moderate asthma with exacerbation, unspecified whether persistent    Rx / DC Orders ED Discharge Orders          Ordered    predniSONE (DELTASONE) 50 MG tablet  Daily        04/05/22 2339              Evalee Jefferson, PA-C 04/09/22 2215    Dorie Rank, MD 04/10/22 [phone]

## 2022-04-18 ENCOUNTER — Ambulatory Visit: Payer: 59 | Admitting: Family Medicine

## 2022-05-11 ENCOUNTER — Ambulatory Visit: Payer: 59 | Admitting: Family Medicine

## 2022-05-15 ENCOUNTER — Encounter: Payer: Self-pay | Admitting: Internal Medicine

## 2022-05-15 ENCOUNTER — Ambulatory Visit (INDEPENDENT_AMBULATORY_CARE_PROVIDER_SITE_OTHER): Payer: 59 | Admitting: Internal Medicine

## 2022-05-15 VITALS — BP 123/78 | HR 82 | Resp 16 | Ht 68.0 in | Wt 138.4 lb

## 2022-05-15 DIAGNOSIS — J4530 Mild persistent asthma, uncomplicated: Secondary | ICD-10-CM

## 2022-05-15 DIAGNOSIS — J4550 Severe persistent asthma, uncomplicated: Secondary | ICD-10-CM | POA: Insufficient documentation

## 2022-05-15 DIAGNOSIS — J45909 Unspecified asthma, uncomplicated: Secondary | ICD-10-CM | POA: Insufficient documentation

## 2022-05-15 DIAGNOSIS — J449 Chronic obstructive pulmonary disease, unspecified: Secondary | ICD-10-CM | POA: Insufficient documentation

## 2022-05-15 MED ORDER — ALBUTEROL SULFATE HFA 108 (90 BASE) MCG/ACT IN AERS: 2.0000 | INHALATION_SPRAY | Freq: Four times a day (QID) | RESPIRATORY_TRACT | 2 refills | Status: DC | PRN

## 2022-05-15 MED ORDER — PULMICORT FLEXHALER 90 MCG/ACT IN AEPB: 2.0000 | INHALATION_SPRAY | Freq: Two times a day (BID) | RESPIRATORY_TRACT | 2 refills | Status: DC

## 2022-05-15 NOTE — Patient Instructions (Signed)
Thank you for trusting me with your care. To recap, today we discussed the following:   Mild persistent asthma without complication - CBC with Differential/Platelet - BMP8+EGFR - Pulmonary function test - Budesonide (PULMICORT FLEXHALER) 90 MCG/ACT inhaler; Inhale 2 puffs into the lungs 2 (two) times daily.  Dispense: 1 each; Refill: 2 - albuterol (VENTOLIN HFA) 108 (90 Base) MCG/ACT inhaler; Inhale 2 puffs into the lungs every 6 (six) hours as needed for wheezing or shortness of breath.  Dispense: 8 g; Refill: 2

## 2022-05-15 NOTE — Assessment & Plan Note (Signed)
Lee Meyer presents for evaluation of asthma. Patient's symptoms include chest tightness, dyspnea, and wheezing. The patient has been suffering from these symptoms for approximately 3 year. Symptoms are triggered by extremes of temperature. His symptoms are the worst in the winter when temperatures are near freezing. Medications used in the past to treat these symptoms include SABA. Patient also works at a [employer]. This is a dusty environment.  Patient is awoken from sleep approximately 1 times per night. Patient has not required Emergency Room treatment for these symptoms, and has not required hospitalization. The patient has not been intubated in the past. No history of asthma as a child. Smokes cigar once weekly and mariajuana occasionally . He has never had PFT's  Assessment/Plan: Mild persistent asthma, uncontrolled. Starting low dose inhaled glucocorticoid today and SABA. Personally reviewed chest xray from 04/05/22 and no hyperinflation or inflammatory changes seen. Lab work, PFT's and medications ordered as below. Follow up in 3 months or sooner if symptoms are uncontrolled.  - Budesonide (PULMICORT FLEXHALER) 90 MCG/ACT inhaler; Inhale 2 puffs into the lungs 2 (two) times daily.  Dispense: 1 each; Refill: 2 - albuterol (VENTOLIN HFA) 108 (90 Base) MCG/ACT inhaler; Inhale 2 puffs into the lungs every 6 (six) hours as needed for wheezing or shortness of breath.  Dispense: 8 g; Refill: 2 - BMP8+EGFR - CBC with Differential/Platelet - Pulmonary function test

## 2022-05-15 NOTE — Progress Notes (Signed)
CC:  Chief Complaint  Patient presents with   Establish Care   Asthma    States he has been having issues with attacks and nothing to take     HPI:Mr.Lee Meyer is a 36 y.o. male who presents to establish care and evaluation of asthma. For the details of today's visit, please refer to the assessment and plan.  Past Medical History:  Diagnosis Date   Asthma    GERD (gastroesophageal reflux disease)     History reviewed. No pertinent surgical history.  Family History  Problem Relation Age of Onset   Asthma Mother     Social History   Tobacco Use   Smoking status: Some Days    Types: Cigars   Smokeless tobacco: Never   Tobacco comments:    Cigarettes/ Black & Milds  Vaping Use   Vaping Use: Never used  Substance Use Topics   Alcohol use: Not Currently    Alcohol/week: 1.0 standard drink of alcohol    Types: 1 Cans of beer per week    Comment: occ   Drug use: Not Currently    Types: Marijuana    Review of Systems:    Review of Systems  Constitutional:  Negative for chills and fever.  Respiratory:  Negative for cough, sputum production and shortness of breath.   Cardiovascular:  Negative for chest pain and leg swelling.     Physical Exam: Vitals:   05/15/22 0808  BP: 123/78  Pulse: 82  Resp: 16  SpO2: 95%  Weight: 138 lb 6.4 oz (62.8 kg)  Height: _0  (1.727 m)     Physical Exam Constitutional:      General: He is not in acute distress.    Appearance: Normal appearance.  HENT:     Head: Normocephalic and atraumatic.     Right Ear: External ear normal.     Left Ear: External ear normal.     Nose: No congestion or rhinorrhea.     Mouth/Throat:     Mouth: Mucous membranes are moist.     Pharynx: No oropharyngeal exudate or posterior oropharyngeal erythema.  Eyes:     General: No scleral icterus.    Extraocular Movements: Extraocular movements intact.     Conjunctiva/sclera: Conjunctivae normal.  Cardiovascular:     Rate and Rhythm:  Normal rate and regular rhythm.     Heart sounds: No murmur heard.    No friction rub. No gallop.  Pulmonary:     Effort: Pulmonary effort is normal.     Breath sounds: No wheezing, rhonchi or rales.  Abdominal:     General: Bowel sounds are normal.     Palpations: Abdomen is soft.     Tenderness: There is no abdominal tenderness.  Musculoskeletal:        General: No swelling or tenderness.     Cervical back: No rigidity.  Lymphadenopathy:     Cervical: No cervical adenopathy.  Skin:    General: Skin is warm and dry.     Coloration: Skin is not jaundiced.     Findings: No rash.  Psychiatric:        Mood and Affect: Mood normal.        Behavior: Behavior normal.      Assessment & Plan:   Asthma TAHIR BLANK presents for evaluation of asthma. Patient's symptoms include chest tightness, dyspnea, and wheezing. The patient has been suffering from these symptoms for approximately 3 year. Symptoms are triggered by  extremes of temperature. His symptoms are the worst in the winter when temperatures are near freezing. Medications used in the past to treat these symptoms include SABA. Patient also works at a [employer]. This is a dusty environment.  Patient is awoken from sleep approximately 1 times per night. Patient has not required Emergency Room treatment for these symptoms, and has not required hospitalization. The patient has not been intubated in the past. No history of asthma as a child. Smokes cigar once weekly and mariajuana occasionally . He has never had PFT's  Assessment/Plan: Mild persistent asthma, uncontrolled. Starting low dose inhaled glucocorticoid today and SABA. Personally reviewed chest xray from 04/05/22 and no hyperinflation or inflammatory changes seen. Lab work, PFT's and medications ordered as below. Follow up in 3 months or sooner if symptoms are uncontrolled.  - Budesonide (PULMICORT FLEXHALER) 90 MCG/ACT inhaler; Inhale 2 puffs into the lungs  2 (two) times daily.  Dispense: 1 each; Refill: 2 - albuterol (VENTOLIN HFA) 108 (90 Base) MCG/ACT inhaler; Inhale 2 puffs into the lungs every 6 (six) hours as needed for wheezing or shortness of breath.  Dispense: 8 g; Refill: 2 - BMP8+EGFR - CBC with Differential/Platelet - Pulmonary function test    Lorene Dy, MD

## 2022-05-16 LAB — CBC WITH DIFFERENTIAL/PLATELET
Basophils Absolute: 0.1 10*3/uL (ref 0.0–0.2)
Basos: 1 %
EOS (ABSOLUTE): 0.9 10*3/uL — ABNORMAL HIGH (ref 0.0–0.4)
Eos: 12 %
Hematocrit: 46.8 % (ref 37.5–51.0)
Hemoglobin: 15.6 g/dL (ref 13.0–17.7)
Immature Grans (Abs): 0 10*3/uL (ref 0.0–0.1)
Immature Granulocytes: 0 %
Lymphocytes Absolute: 2.8 10*3/uL (ref 0.7–3.1)
Lymphs: 38 %
MCH: 27.9 pg (ref 26.6–33.0)
MCHC: 33.3 g/dL (ref 31.5–35.7)
MCV: 84 fL (ref 79–97)
Monocytes Absolute: 0.6 10*3/uL (ref 0.1–0.9)
Monocytes: 8 %
Neutrophils Absolute: 3 10*3/uL (ref 1.4–7.0)
Neutrophils: 41 %
Platelets: 232 10*3/uL (ref 150–450)
RBC: 5.59 x10E6/uL (ref 4.14–5.80)
RDW: 14 % (ref 11.6–15.4)
WBC: 7.4 10*3/uL (ref 3.4–10.8)

## 2022-05-16 LAB — BMP8+EGFR
BUN/Creatinine Ratio: 14 (ref 9–20)
BUN: 12 mg/dL (ref 6–20)
CO2: 24 mmol/L (ref 20–29)
Calcium: 9.9 mg/dL (ref 8.7–10.2)
Chloride: 104 mmol/L (ref 96–106)
Creatinine, Ser: 0.87 mg/dL (ref 0.76–1.27)
Glucose: 84 mg/dL (ref 70–99)
Potassium: 4.6 mmol/L (ref 3.5–5.2)
Sodium: 142 mmol/L (ref 134–144)
eGFR: 114 mL/min/{1.73_m2} (ref >59)

## 2022-05-18 ENCOUNTER — Other Ambulatory Visit: Payer: Self-pay

## 2022-05-18 MED ORDER — FLUTICASONE PROPIONATE HFA 44 MCG/ACT IN AERO: 2.0000 | INHALATION_SPRAY | Freq: Two times a day (BID) | RESPIRATORY_TRACT | 12 refills | Status: DC

## 2022-05-22 ENCOUNTER — Other Ambulatory Visit: Payer: Self-pay

## 2022-05-22 ENCOUNTER — Emergency Department (HOSPITAL_COMMUNITY)
Admission: EM | Admit: 2022-05-22 | Discharge: 2022-05-22 | Disposition: A | Discharge status: Home / Self Care | Payer: 59 | Attending: Emergency Medicine | Admitting: Emergency Medicine

## 2022-05-22 ENCOUNTER — Encounter (HOSPITAL_COMMUNITY): Payer: Self-pay

## 2022-05-22 ENCOUNTER — Emergency Department (HOSPITAL_COMMUNITY): Payer: 59

## 2022-05-22 DIAGNOSIS — R062 Wheezing: Secondary | ICD-10-CM

## 2022-05-22 DIAGNOSIS — R0602 Shortness of breath: Secondary | ICD-10-CM | POA: Diagnosis not present

## 2022-05-22 DIAGNOSIS — J45909 Unspecified asthma, uncomplicated: Secondary | ICD-10-CM | POA: Diagnosis not present

## 2022-05-22 DIAGNOSIS — R69 Illness, unspecified: Secondary | ICD-10-CM | POA: Diagnosis not present

## 2022-05-22 DIAGNOSIS — Z716 Tobacco abuse counseling: Secondary | ICD-10-CM | POA: Diagnosis not present

## 2022-05-22 DIAGNOSIS — F17200 Nicotine dependence, unspecified, uncomplicated: Secondary | ICD-10-CM | POA: Insufficient documentation

## 2022-05-22 MED ORDER — IPRATROPIUM-ALBUTEROL 0.5-2.5 (3) MG/3ML IN SOLN
3.0000 mL | Freq: Once | RESPIRATORY_TRACT | Status: AC
  Administered 2022-05-22: 3 mL via RESPIRATORY_TRACT
  Filled 2022-05-22: qty 3

## 2022-05-22 MED ORDER — DEXAMETHASONE 4 MG PO TABS
6.0000 mg | ORAL_TABLET | Freq: Once | ORAL | Status: AC
  Administered 2022-05-22: 6 mg via ORAL
  Filled 2022-05-22: qty 2

## 2022-05-22 NOTE — ED Provider Notes (Signed)
[facility] EMERGENCY DEPARTMENT Provider Note   CSN: [id number] Arrival date & time: 05/22/22  0815     History No chief complaint on file.   HPI Lee Meyer is a 36 y.o. male presenting for mild shortness of breath.  He has a history of asthma and is smoking at this time.  He states he tried to cut back on smoking.  He seen by his PCP' and prescribed budesonide.  He went to pick up the budesonide yesterday but could not afford it.  Per chart review.  It appears that patient also had Flovent prescribed afterwards likely secondary to insurance concerns per patient.  He did not realize that a second medication had been prescribed.  He denies fevers or chills, nausea vomiting, syncope shortness of breath.  He does have the albuterol inhaler.  Patient states his main concern was how to get access to the medications he was prescribed.   Patient's recorded medical, surgical, social, medication list and allergies were reviewed in the Snapshot window as part of the initial history.   Review of Systems   Review of Systems  Constitutional:  Negative for chills and fever.  HENT:  Negative for ear pain and sore throat.   Eyes:  Negative for pain and visual disturbance.  Respiratory:  Positive for shortness of breath and wheezing. Negative for cough.   Cardiovascular:  Negative for chest pain and palpitations.  Gastrointestinal:  Negative for abdominal pain and vomiting.  Genitourinary:  Negative for dysuria and hematuria.  Musculoskeletal:  Negative for arthralgias and back pain.  Skin:  Negative for color change and rash.  Neurological:  Negative for seizures and syncope.  All other systems reviewed and are negative.   Physical Exam Updated Vital Signs There were no vitals taken for this visit. Physical Exam Vitals and nursing note reviewed.  Constitutional:      General: He is not in acute distress.    Appearance: He is well-developed.  HENT:     Head: Normocephalic and  atraumatic.  Eyes:     Conjunctiva/sclera: Conjunctivae normal.  Cardiovascular:     Rate and Rhythm: Normal rate and regular rhythm.     Heart sounds: No murmur heard. Pulmonary:     Effort: Pulmonary effort is normal. No respiratory distress.     Breath sounds: Normal breath sounds.  Abdominal:     Palpations: Abdomen is soft.     Tenderness: There is no abdominal tenderness.  Musculoskeletal:        General: No swelling.     Cervical back: Neck supple.  Skin:    General: Skin is warm and dry.     Capillary Refill: Capillary refill takes less than 2 seconds.  Neurological:     Mental Status: He is alert.  Psychiatric:        Mood and Affect: Mood normal.      ED Course/ Medical Decision Making/ A&P    Procedures Procedures   Medications Ordered in ED Medications - No data to display  Medical Decision Making:    Lee Meyer is a 36 y.o. male who presented to the ED today with mild SOB detailed above.     Patient's presentation is complicated by their history of asthma and tobacco use.  Patient placed on continuous vitals and telemetry monitoring while in ED which was reviewed periodically.   Complete initial physical exam performed, notably the patient  was hemodynamically stable in no acute distress.  Reviewed and confirmed nursing documentation for past medical history, family history, social history.    Initial Assessment:   With the patient's presentation of shortness of breath and wheezing, most likely diagnosis is mild asthma exacerbation. Other diagnoses were considered including (but not limited to) developing COPD, pneumonia, pneumothorax. These are considered less likely due to history of present illness and physical exam findings.   This is most consistent with an acute life/limb threatening illness complicated by underlying chronic conditions.  Initial Plan:  CXR to evaluate for structural/infectious intrathoracic pathology.  Objective evaluation  as below reviewed with plan for close reassessment  Initial Study Results:   Radiology  All images reviewed independently.Agree with radiology report at this time.   No results found.    Final Assessment and Plan:   Patient treated with DuoNeb and dexamethasone given treatment resistant in the outpatient setting in emergency department, x-ray performed with no evidence of acute pathology.  Patient is hemodynamically stable in no acute distress on reassessment.  Patient provided good Rx card and information on how to pick up his medications, no acute indication for further intervention in emergency department.  Discussed smoking cessation.   Disposition:  I have considered need for hospitalization, however, considering all of the above, I believe this patient is stable for discharge at this time.  Patient/family educated about specific return precautions for given chief complaint and symptoms.  Patient/family educated about follow-up with PCP.     Patient/family expressed understanding of return precautions and need for follow-up. Patient spoken to regarding all imaging and laboratory results and appropriate follow up for these results. All education provided in verbal form with additional information in written form. Time was allowed for answering of patient questions. Patient discharged.    Emergency Department Medication Summary:   Medications  ipratropium-albuterol (DUONEB) 0.5-2.5 (3) MG/3ML nebulizer solution 3 mL (has no administration in time range)  dexamethasone (DECADRON) tablet 6 mg (has no administration in time range)         Clinical Impression: No diagnosis found.   Data Unavailable   Final Clinical Impression(s) / ED Diagnoses Final diagnoses:  None    Rx / DC Orders ED Discharge Orders     None         Lee Ade, MD 05/22/22 1627

## 2022-05-22 NOTE — Discharge Instructions (Signed)
You are seen today for shortness of breath.  Your exam is most consistent with a mild asthma exacerbation.  We have treated with a DuoNeb, you have albuterol.  You have been prescribed fluticasone by your primary care provider.  Please pick this medication up and initiated 2 puffs twice daily. Thank for the opportunity to participate in your care, Glyn Ade MD

## 2022-05-22 NOTE — ED Triage Notes (Signed)
Patient has hx of asthma and was prescribed albuterol inhaler that has ran out and states he cannot not a deep breath, so he feels SHOB. Ambulatory in triage and talking in complete sentences.

## 2022-05-22 NOTE — ED Notes (Signed)
RT at bedside for breathing tx.

## 2022-06-16 ENCOUNTER — Encounter (HOSPITAL_COMMUNITY): Payer: Self-pay

## 2022-06-16 ENCOUNTER — Other Ambulatory Visit: Payer: Self-pay

## 2022-06-16 ENCOUNTER — Emergency Department (HOSPITAL_COMMUNITY)
Admission: EM | Admit: 2022-06-16 | Discharge: 2022-06-16 | Disposition: A | Discharge status: Home / Self Care | Payer: 59 | Attending: Emergency Medicine | Admitting: Emergency Medicine

## 2022-06-16 ENCOUNTER — Emergency Department (HOSPITAL_COMMUNITY): Payer: 59

## 2022-06-16 DIAGNOSIS — J101 Influenza due to other identified influenza virus with other respiratory manifestations: Secondary | ICD-10-CM | POA: Diagnosis not present

## 2022-06-16 DIAGNOSIS — Z1152 Encounter for screening for COVID-19: Secondary | ICD-10-CM | POA: Insufficient documentation

## 2022-06-16 DIAGNOSIS — J4530 Mild persistent asthma, uncomplicated: Secondary | ICD-10-CM | POA: Insufficient documentation

## 2022-06-16 DIAGNOSIS — R6889 Other general symptoms and signs: Secondary | ICD-10-CM

## 2022-06-16 DIAGNOSIS — J111 Influenza due to unidentified influenza virus with other respiratory manifestations: Secondary | ICD-10-CM | POA: Diagnosis not present

## 2022-06-16 DIAGNOSIS — R059 Cough, unspecified: Secondary | ICD-10-CM | POA: Diagnosis not present

## 2022-06-16 DIAGNOSIS — R0602 Shortness of breath: Secondary | ICD-10-CM | POA: Diagnosis not present

## 2022-06-16 LAB — RESP PANEL BY RT-PCR (FLU A&B, COVID) ARPGX2
Influenza A by PCR: POSITIVE — AB
Influenza B by PCR: NEGATIVE
SARS Coronavirus 2 by RT PCR: NEGATIVE

## 2022-06-16 MED ORDER — ALBUTEROL SULFATE (2.5 MG/3ML) 0.083% IN NEBU
2.5000 mg | INHALATION_SOLUTION | Freq: Once | RESPIRATORY_TRACT | Status: AC
  Administered 2022-06-16: 2.5 mg via RESPIRATORY_TRACT
  Filled 2022-06-16: qty 3

## 2022-06-16 MED ORDER — BENZONATATE 100 MG PO CAPS: 100.0000 mg | ORAL_CAPSULE | Freq: Three times a day (TID) | ORAL | 0 refills | Status: DC

## 2022-06-16 MED ORDER — ALBUTEROL SULFATE HFA 108 (90 BASE) MCG/ACT IN AERS: 2.0000 | INHALATION_SPRAY | Freq: Four times a day (QID) | RESPIRATORY_TRACT | 2 refills | Status: DC | PRN

## 2022-06-16 MED ORDER — KETOROLAC TROMETHAMINE 15 MG/ML IJ SOLN
15.0000 mg | Freq: Once | INTRAMUSCULAR | Status: AC
  Administered 2022-06-16: 15 mg via INTRAMUSCULAR
  Filled 2022-06-16: qty 1

## 2022-06-16 MED ORDER — BENZONATATE 100 MG PO CAPS
100.0000 mg | ORAL_CAPSULE | Freq: Once | ORAL | Status: AC
  Administered 2022-06-16: 100 mg via ORAL
  Filled 2022-06-16: qty 1

## 2022-06-16 NOTE — ED Provider Notes (Signed)
[facility] EMERGENCY DEPARTMENT Provider Note   CSN: [id number] Arrival date & time: 06/16/22  1548     History No chief complaint on file.   Lee Meyer is a 36 y.o. male with a past medical history of asthma presenting with URI symptoms.  Reports body aches, headaches, wheezing, cough and congestion since yesterday.  No known sick contacts.  Has not been using his albuterol inhaler because he lost it.  HPI     Home Medications Prior to Admission medications   Medication Sig Start Date End Date Taking? Authorizing Provider  benzonatate (TESSALON) 100 MG capsule Take 1 capsule (100 mg total) by mouth every 8 (eight) hours. 06/16/22  Yes Darrious Youman A, PA-C  albuterol (VENTOLIN HFA) 108 (90 Base) MCG/ACT inhaler Inhale 2 puffs into the lungs every 6 (six) hours as needed for wheezing or shortness of breath. 06/16/22   Adyson Vanburen A, PA-C  Budesonide (PULMICORT FLEXHALER) 90 MCG/ACT inhaler Inhale 2 puffs into the lungs 2 (two) times daily. 05/15/22   Gardenia Phlegm, MD  fluticasone (FLOVENT HFA) 44 MCG/ACT inhaler Inhale 2 puffs into the lungs in the morning and at bedtime. 05/18/22   Billie Lade, MD      Allergies    Patient has no known allergies.    Review of Systems   Review of Systems  Physical Exam Updated Vital Signs BP 128/79 (BP Location: Right Arm)   Pulse (!) 109   Temp 99.8 F (37.7 C) (Oral)   Resp 19   Ht 5\' 8"  (1.727 m)   Wt 62.2 kg   SpO2 96%   BMI 20.85 kg/m  Physical Exam Vitals and nursing note reviewed.  Constitutional:      Appearance: Normal appearance.  HENT:     Head: Normocephalic and atraumatic.     Nose: Nose normal. No congestion or rhinorrhea.     Mouth/Throat:     Mouth: Mucous membranes are moist.     Pharynx: Oropharynx is clear.  Eyes:     General: No scleral icterus.    Conjunctiva/sclera: Conjunctivae normal.  Cardiovascular:     Rate and Rhythm: Normal rate and regular rhythm.  Pulmonary:     Effort:  Pulmonary effort is normal. No respiratory distress.     Breath sounds: Wheezing (Mild wheezing in lower lung fields) present.  Skin:    General: Skin is warm and dry.     Findings: No rash.  Neurological:     Mental Status: He is alert.  Psychiatric:        Mood and Affect: Mood normal.     ED Results / Procedures / Treatments   Labs (all labs ordered are listed, but only abnormal results are displayed) Labs Reviewed  RESP PANEL BY RT-PCR (FLU A&B, COVID) ARPGX2    EKG None  Radiology DG Chest 2 View  Result Date: 06/16/2022 CLINICAL DATA:  Shortness of breath EXAM: CHEST - 2 VIEW COMPARISON:  05/22/2022 FINDINGS: The heart size and mediastinal contours are within normal limits. Both lungs are clear. The visualized skeletal structures are unremarkable. IMPRESSION: No acute abnormality of the lungs. Electronically Signed   By: 05/24/2022 M.D.   On: 06/16/2022 16:13    Procedures Procedures   Medications Ordered in ED Medications  albuterol (PROVENTIL) (2.5 MG/3ML) 0.083% nebulizer solution 2.5 mg (has no administration in time range)  ketorolac (TORADOL) 15 MG/ML injection 15 mg (has no administration in time range)  benzonatate (TESSALON) capsule 100 mg (  has no administration in time range)    ED Course/ Medical Decision Making/ A&P                           Medical Decision Making Amount and/or Complexity of Data Reviewed Radiology: ordered.  Risk Prescription drug management.   36 year old male presenting today with URI symptoms.  Started yesterday.  Has tried flu medications, tea, lemon, cough drops with no relief.  He is most concerned that he has been wheezing and out his albuterol inhaler.  Treatment: Toradol, nebulizer treatment by request and Tessalon.  On reevaluation patient reports feeling better.  Testing: COVID and flu pending  MDM/disposition: 36 year old male presenting with URI symptoms.  Hemodynamically stable, afebrile and protecting his  airway.  Do not believe he has an emergent condition requiring further intervention.  No signs of asthma exacerbation.  At this time he will be discharged home with Tessalon, and albuterol inhaler and over-the-counter medications.  He will follow-up on COVID and flu testing online and no longer wants to wait.   Final Clinical Impression(s) / ED Diagnoses Final diagnoses:  Mild persistent asthma without complication    Rx / DC Orders ED Discharge Orders          Ordered    albuterol (VENTOLIN HFA) 108 (90 Base) MCG/ACT inhaler  Every 6 hours PRN        06/16/22 1917    benzonatate (TESSALON) 100 MG capsule  Every 8 hours        06/16/22 1919           Results and diagnoses were explained to the patient. Return precautions discussed in full. Patient had no additional questions and expressed complete understanding.   This chart was dictated using voice recognition software.  Despite best efforts to proofread,  errors can occur which can change the documentation meaning.    Woodroe Chen 06/16/22 2213    Mardene Sayer, MD 06/17/22 [phone]

## 2022-06-16 NOTE — ED Triage Notes (Signed)
Pt presents with HA, bodyaches, and ShOB that started last PM. Pt requesting COVID test. Pt reports no sick contacts.

## 2022-06-16 NOTE — Discharge Instructions (Signed)
You came to the emergency department today for headaches and bodyaches.  We do not have your flu or COVID test back at this time but you do not have any pneumonia on your x-ray.  You may use Motrin and Tylenol over-the-counter for your symptoms.  I have also sent a cough medication called benzonatate to your pharmacy.  You may also use medication such as DayQuil and NyQuil.  Your albuterol inhaler should also be the pharmacy.  Do not hesitate to return to the emergency department with any worsening symptoms however this illness will likely run its course over the weekend.  It was a pleasure to meet you and we hope you feel better!

## 2022-06-19 ENCOUNTER — Telehealth: Payer: Self-pay

## 2022-06-19 NOTE — Telephone Encounter (Signed)
Transition Care Management Unsuccessful Follow-up Telephone Call  Date of discharge and from where:  [facility]  Attempts:  1st Attempt  Reason for unsuccessful TCM follow-up call:  No answer/busy Karena Addison, LPN [facility] Nurse Health Advisor Direct Dial [phone]

## 2022-06-20 ENCOUNTER — Ambulatory Visit (HOSPITAL_COMMUNITY)
Admission: RE | Admit: 2022-06-20 | Discharge: 2022-06-20 | Disposition: A | Discharge status: Home / Self Care | Payer: 59 | Source: Ambulatory Visit | Attending: Internal Medicine | Admitting: Internal Medicine

## 2022-06-20 DIAGNOSIS — J4530 Mild persistent asthma, uncomplicated: Secondary | ICD-10-CM | POA: Diagnosis not present

## 2022-06-20 LAB — PULMONARY FUNCTION TEST
DL/VA % pred: 118 %
DL/VA: 5.62 ml/min/mmHg/L
DLCO unc % pred: 63 %
DLCO unc: 18.98 ml/min/mmHg
FEF 25-75 Post: 0.56 L/sec
FEF 25-75 Pre: 0.61 L/sec
FEF2575-%Change-Post: -8 %
FEF2575-%Pred-Post: 14 %
FEF2575-%Pred-Pre: 15 %
FEV1-%Change-Post: -2 %
FEV1-%Pred-Post: 29 %
FEV1-%Pred-Pre: 30 %
FEV1-Post: 1.2 L
FEV1-Pre: 1.23 L
FEV1FVC-%Change-Post: -3 %
FEV1FVC-%Pred-Pre: 62 %
FEV6-%Change-Post: -2 %
FEV6-%Pred-Post: 47 %
FEV6-%Pred-Pre: 48 %
FEV6-Post: 2.35 L
FEV6-Pre: 2.41 L
FEV6FVC-%Change-Post: 0 %
FEV6FVC-%Pred-Post: 99 %
FEV6FVC-%Pred-Pre: 100 %
FVC-%Change-Post: 0 %
FVC-%Pred-Post: 49 %
FVC-%Pred-Pre: 48 %
FVC-Post: 2.47 L
FVC-Pre: 2.45 L
Post FEV1/FVC ratio: 48 %
Post FEV6/FVC ratio: 98 %
Pre FEV1/FVC ratio: 50 %
Pre FEV6/FVC Ratio: 98 %
RV % pred: 200 %
RV: 3.32 L
TLC % pred: 85 %
TLC: 5.54 L

## 2022-06-20 MED ORDER — ALBUTEROL SULFATE (2.5 MG/3ML) 0.083% IN NEBU
2.5000 mg | INHALATION_SOLUTION | Freq: Once | RESPIRATORY_TRACT | Status: AC
  Administered 2022-06-20: 2.5 mg via RESPIRATORY_TRACT

## 2022-06-20 NOTE — Telephone Encounter (Signed)
Transition Care Management Unsuccessful Follow-up Telephone Call  Date of discharge and from where:  [facility]  Attempts:  2nd Attempt  Reason for unsuccessful TCM follow-up call:  No answer/busy Karena Addison, LPN [facility] Nurse Health Advisor Direct Dial [phone]

## 2022-06-21 NOTE — Progress Notes (Signed)
Called patient and reviewed PFT's. PFT's inconsistent with diagnosis of asthma. I have asked patient to make follow up appointment in the next few weeks.

## 2022-06-22 NOTE — Telephone Encounter (Signed)
Transition Care Management Unsuccessful Follow-up Telephone Call  Date of discharge and from where:  [facility] 06/16/2022  Attempts:  3rd Attempt  Reason for unsuccessful TCM follow-up call:  No answer/busy Karena Addison, LPN [facility] Nurse Health Advisor Direct Dial [phone]

## 2022-06-24 ENCOUNTER — Other Ambulatory Visit: Payer: Self-pay

## 2022-06-24 ENCOUNTER — Encounter (HOSPITAL_COMMUNITY): Payer: Self-pay | Admitting: Emergency Medicine

## 2022-06-24 ENCOUNTER — Emergency Department (HOSPITAL_COMMUNITY)
Admission: EM | Admit: 2022-06-24 | Discharge: 2022-06-24 | Disposition: A | Discharge status: Home / Self Care | Payer: 59 | Attending: Emergency Medicine | Admitting: Emergency Medicine

## 2022-06-24 DIAGNOSIS — H60311 Diffuse otitis externa, right ear: Secondary | ICD-10-CM | POA: Diagnosis not present

## 2022-06-24 DIAGNOSIS — H9201 Otalgia, right ear: Secondary | ICD-10-CM | POA: Diagnosis not present

## 2022-06-24 MED ORDER — NEOMYCIN-POLYMYXIN-HC 1 % OT SOLN
3.0000 [drp] | Freq: Four times a day (QID) | OTIC | Status: DC
  Administered 2022-06-24: 3 [drp] via OTIC
  Filled 2022-06-24: qty 10

## 2022-06-24 NOTE — ED Provider Notes (Signed)
  [facility] EMERGENCY DEPARTMENT Provider Note   CSN: [id number] Arrival date & time: 06/24/22  1540     History  Chief Complaint  Patient presents with   Otalgia    Lee Meyer is a 36 y.o. male.  HPI Patient presents with ear fullness and pain for 3 days.  He is otherwise well, has no medical problems takes no medication regularly.  No relief with anything, no sore throat, no fever, no chills.    Home Medications Prior to Admission medications   Medication Sig Start Date End Date Taking? Authorizing Provider  albuterol (VENTOLIN HFA) 108 (90 Base) MCG/ACT inhaler Inhale 2 puffs into the lungs every 6 (six) hours as needed for wheezing or shortness of breath. 06/16/22   Redwine, Madison A, PA-C  benzonatate (TESSALON) 100 MG capsule Take 1 capsule (100 mg total) by mouth every 8 (eight) hours. 06/16/22   Redwine, Madison A, PA-C  Budesonide (PULMICORT FLEXHALER) 90 MCG/ACT inhaler Inhale 2 puffs into the lungs 2 (two) times daily. 05/15/22   Gardenia Phlegm, MD  fluticasone (FLOVENT HFA) 44 MCG/ACT inhaler Inhale 2 puffs into the lungs in the morning and at bedtime. 05/18/22   Billie Lade, MD      Allergies    Patient has no known allergies.    Review of Systems   Review of Systems  All other systems reviewed and are negative.   Physical Exam Updated Vital Signs BP 113/71   Pulse 60   Temp 98.7 F (37.1 C)   Resp 18   Ht 5\' 8"  (1.727 m)   Wt 62.1 kg   SpO2 96%   BMI 20.83 kg/m  Physical Exam Vitals and nursing note reviewed.  Constitutional:      General: He is not in acute distress.    Appearance: He is well-developed.  HENT:     Head: Normocephalic and atraumatic.   Eyes:     Conjunctiva/sclera: Conjunctivae normal.  Pulmonary:     Effort: Pulmonary effort is normal. No respiratory distress.     Breath sounds: No stridor.  Abdominal:     General: There is no distension.  Skin:    General: Skin is warm and dry.  Neurological:     Mental Status:  He is alert and oriented to person, place, and time.     ED Results / Procedures / Treatments   Labs (all labs ordered are listed, but only abnormal results are displayed) Labs Reviewed - No data to display  EKG None  Radiology No results found.  Procedures Procedures    Medications Ordered in ED Medications  NEOMYCIN-POLYMYXIN-HYDROCORTISONE (CORTISPORIN) OTIC (EAR) solution 3 drop (has no administration in time range)    ED Course/ Medical Decision Making/ A&P                           Medical Decision Making Well-appearing adult male presents with ear pain, is found to have evidence for external otitis on exam no evidence for bacteremia, sepsis, no evidence for other pathology, oropharynx is unremarkable patient started on topical medication here and will follow-up with ENT and primary care.  Risk Prescription drug management.  Final Clinical Impression(s) / ED Diagnoses Final diagnoses:  Acute diffuse otitis externa of right ear    Rx / DC Orders ED Discharge Orders     None         , MD 06/24/22 [phone]

## 2022-06-24 NOTE — ED Triage Notes (Signed)
Pain and swelling to right ear. Denis injury.

## 2022-06-24 NOTE — Discharge Instructions (Signed)
As discussed, you have been diagnosed with an ear infection.  Please use the provided eardrops, 3 times daily for the next 5 days.  Return here for concerning changes, or follow-up with our ENT colleagues for appropriate ongoing care.

## 2022-06-27 ENCOUNTER — Ambulatory Visit: Payer: 59 | Admitting: Internal Medicine

## 2022-07-14 ENCOUNTER — Telehealth: Payer: Self-pay | Admitting: Internal Medicine

## 2022-07-14 ENCOUNTER — Other Ambulatory Visit: Payer: Self-pay

## 2022-07-14 DIAGNOSIS — J4530 Mild persistent asthma, uncomplicated: Secondary | ICD-10-CM

## 2022-07-14 MED ORDER — ALBUTEROL SULFATE HFA 108 (90 BASE) MCG/ACT IN AERS: 2.0000 | INHALATION_SPRAY | Freq: Four times a day (QID) | RESPIRATORY_TRACT | 2 refills | Status: DC | PRN

## 2022-07-14 NOTE — Telephone Encounter (Signed)
Med refill albuterol (VENTOLIN HFA) 108 (90 Base) MCG/ACT inhaler [[id number]]   Pharmacy  [id number] - [place], [address] [place], [place] [address] Phone: [phone]  Fax: [phone] DEA #: [id number]

## 2022-07-14 NOTE — Telephone Encounter (Signed)
Refill sent.

## 2022-07-16 IMAGING — CR DG CHEST 2V
2 series · 2 of 2 positions shown · non-contrast
Comparison: 10/22/2019.

CLINICAL DATA: Chest pain.

EXAM:
CHEST - 2 VIEW

[chest pa]
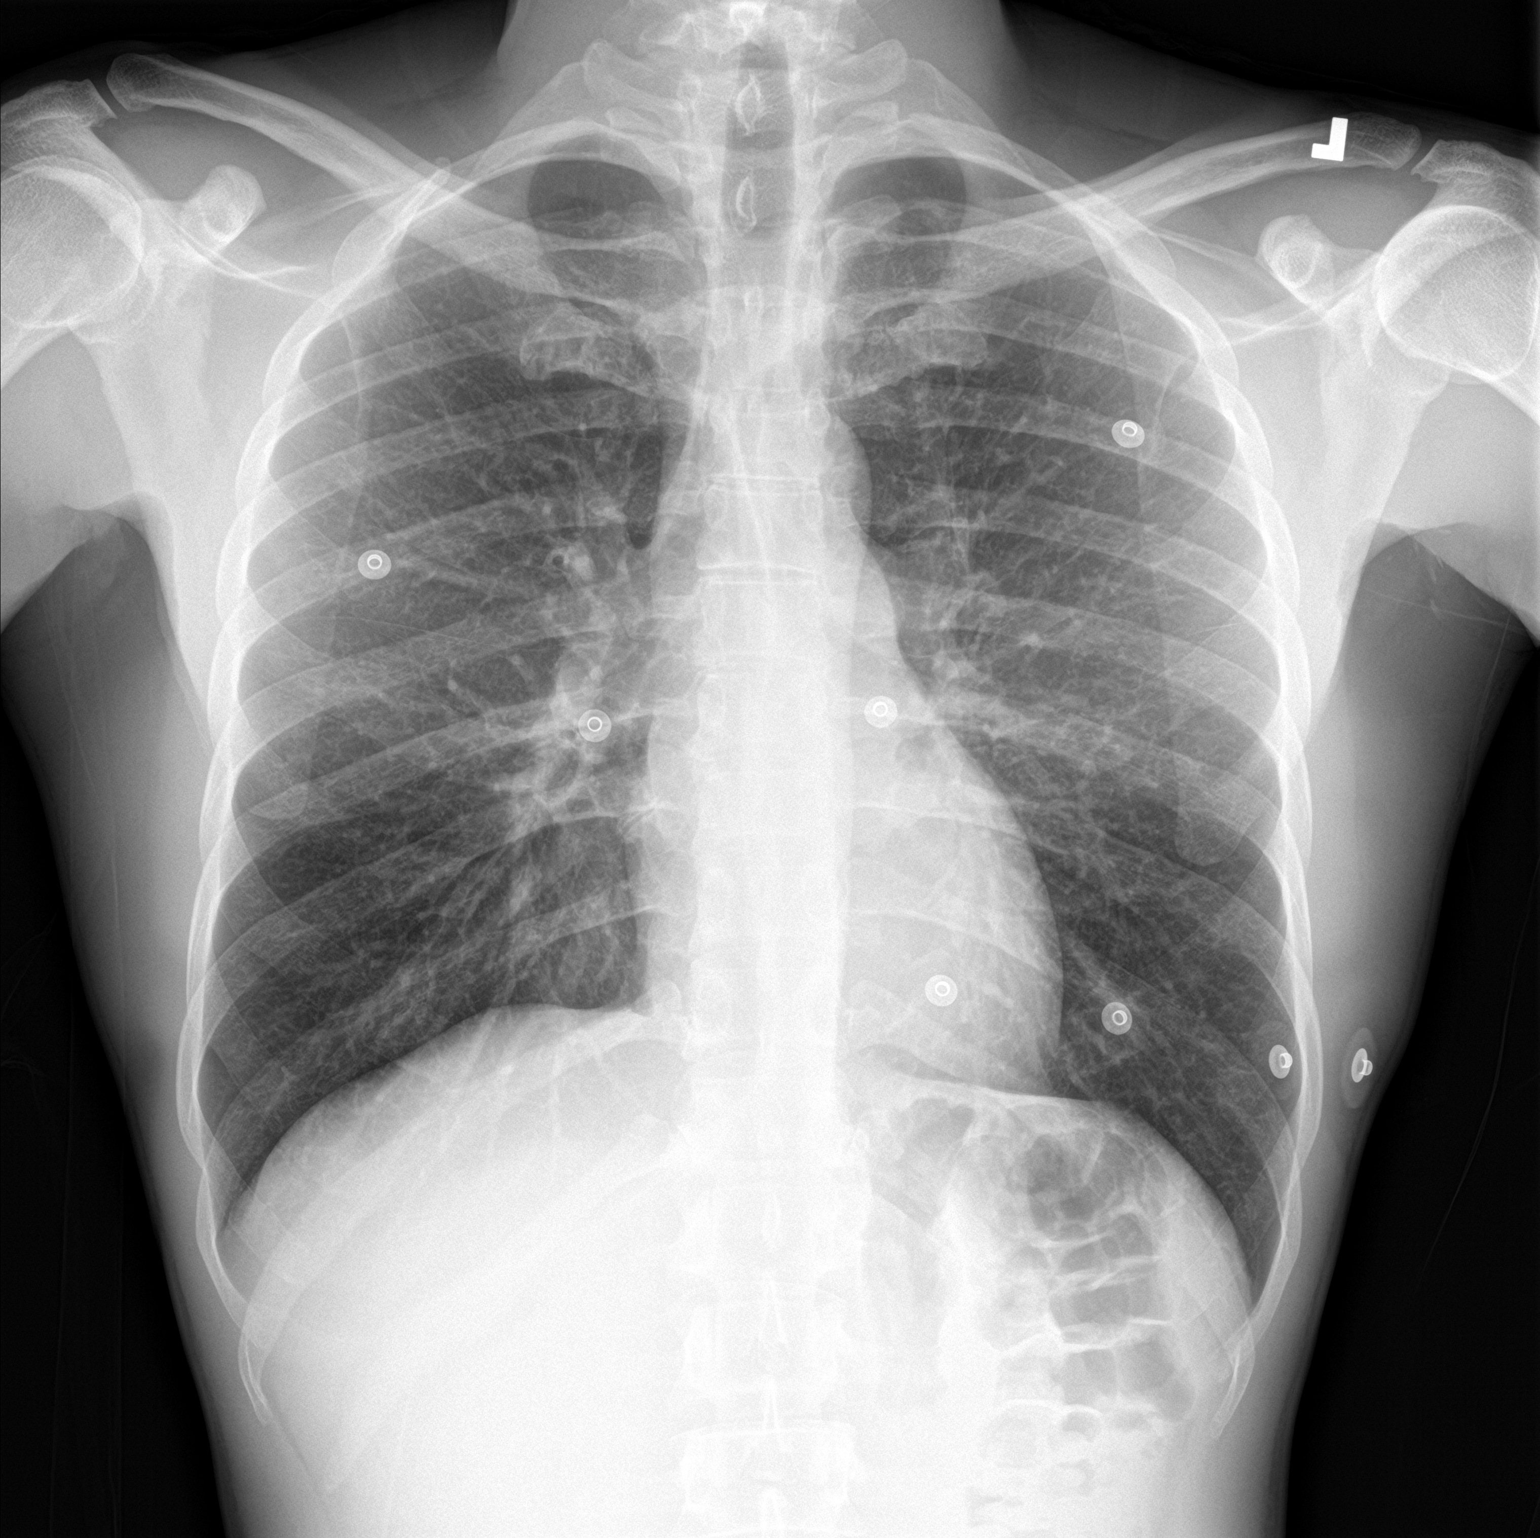

[chest lat]
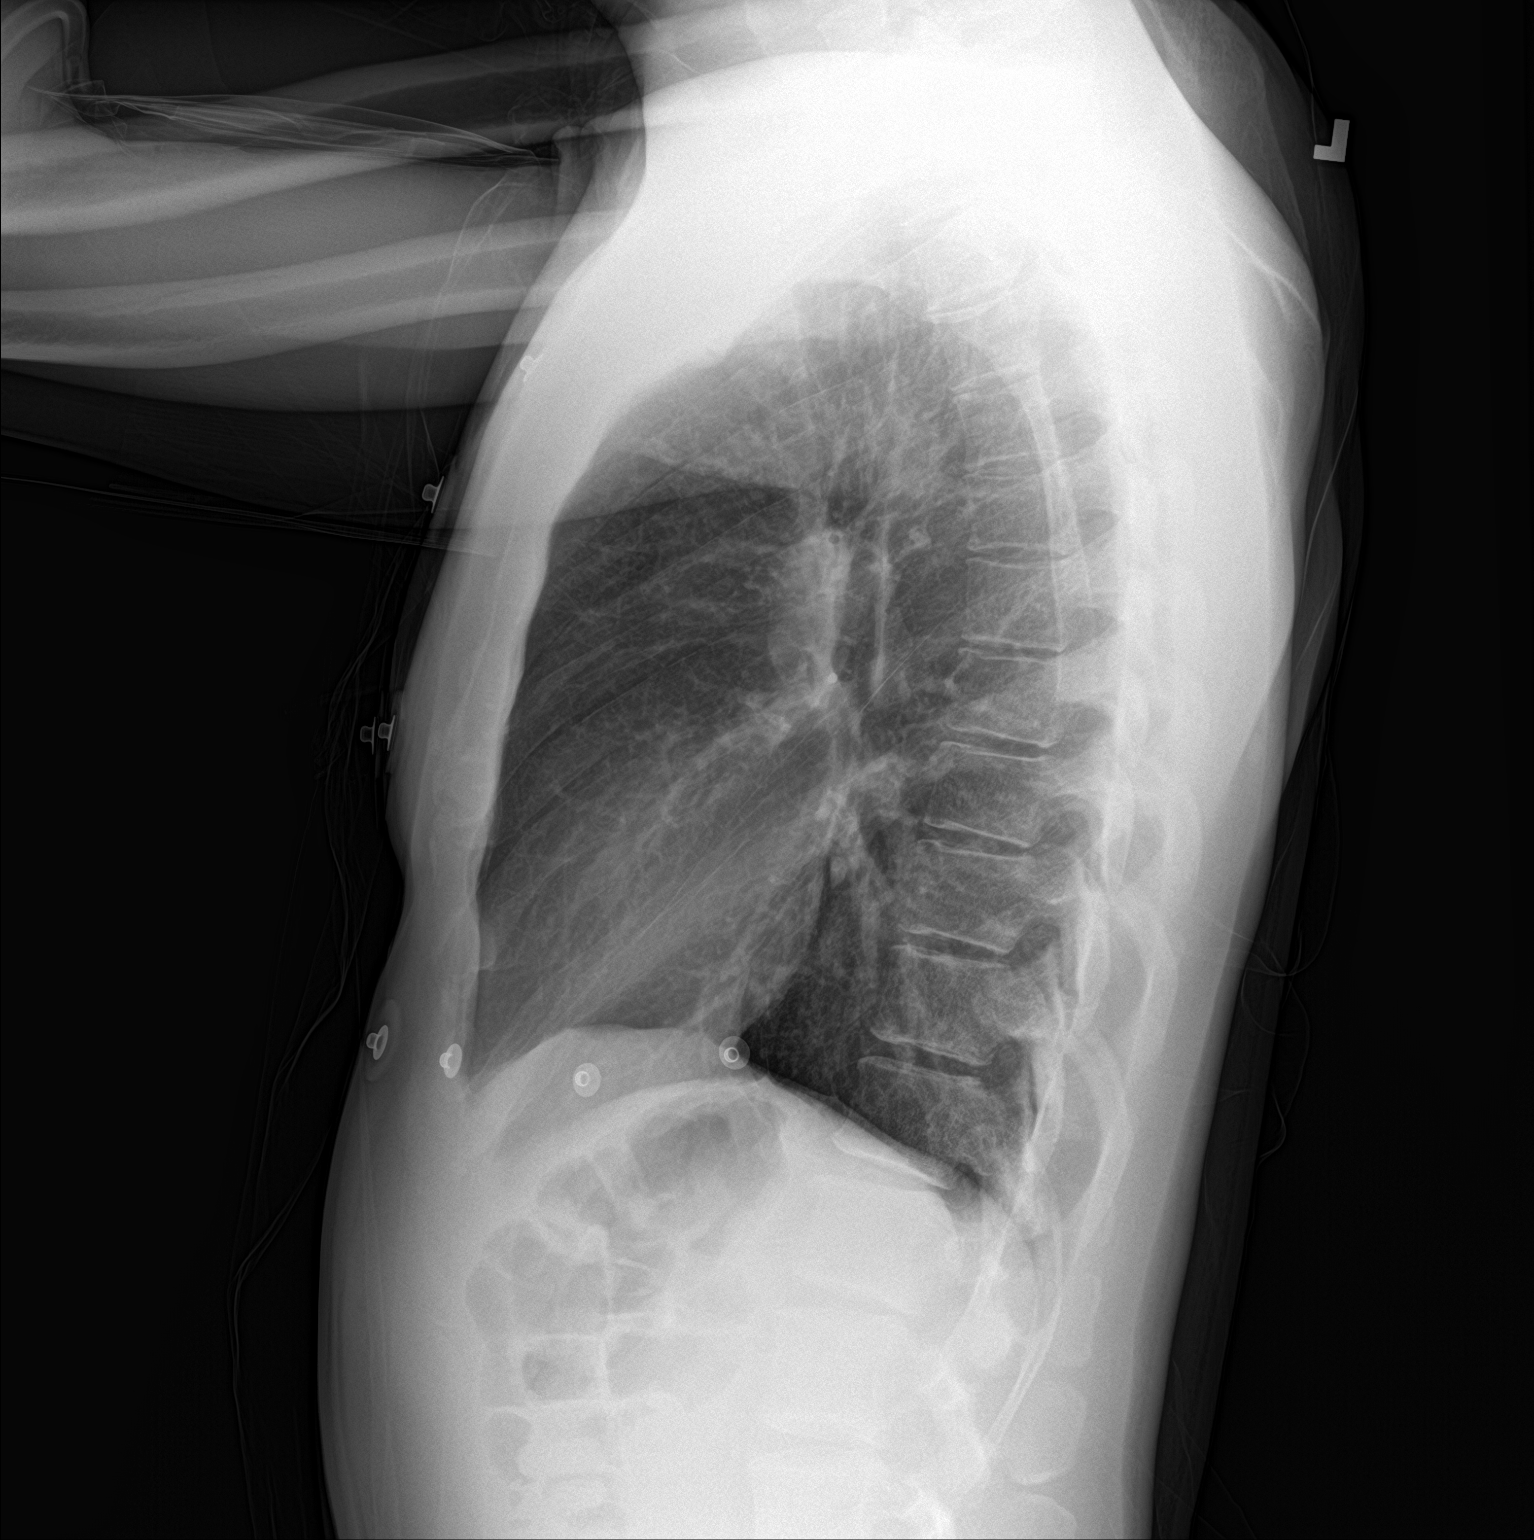

[2 of 2 positions shown; findings below may reference images not displayed]

FINDINGS: Mediastinum and hilar structures normal. Lungs are clear. No focal
infiltrate. No pleural effusion or pneumothorax. Heart size normal.
No acute bony abnormality.
IMPRESSION: No acute cardiopulmonary disease.  Chest is stable from prior exam.

## 2022-07-18 ENCOUNTER — Ambulatory Visit: Payer: 59 | Admitting: Internal Medicine

## 2022-07-20 ENCOUNTER — Encounter (HOSPITAL_COMMUNITY): Payer: Self-pay | Admitting: *Deleted

## 2022-07-20 ENCOUNTER — Emergency Department (HOSPITAL_COMMUNITY)
Admission: EM | Admit: 2022-07-20 | Discharge: 2022-07-20 | Disposition: A | Discharge status: Home / Self Care | Payer: BC Managed Care – PPO | Attending: Student | Admitting: Student

## 2022-07-20 ENCOUNTER — Other Ambulatory Visit: Payer: Self-pay

## 2022-07-20 ENCOUNTER — Emergency Department (HOSPITAL_COMMUNITY): Payer: BC Managed Care – PPO

## 2022-07-20 DIAGNOSIS — Z7951 Long term (current) use of inhaled steroids: Secondary | ICD-10-CM | POA: Insufficient documentation

## 2022-07-20 DIAGNOSIS — R000 Tachycardia, unspecified: Secondary | ICD-10-CM | POA: Insufficient documentation

## 2022-07-20 DIAGNOSIS — J4541 Moderate persistent asthma with (acute) exacerbation: Secondary | ICD-10-CM | POA: Insufficient documentation

## 2022-07-20 DIAGNOSIS — R0602 Shortness of breath: Secondary | ICD-10-CM | POA: Diagnosis not present

## 2022-07-20 MED ORDER — PREDNISONE 20 MG PO TABS: 40.0000 mg | ORAL_TABLET | Freq: Every day | ORAL | 0 refills | Status: DC

## 2022-07-20 MED ORDER — IPRATROPIUM-ALBUTEROL 0.5-2.5 (3) MG/3ML IN SOLN
3.0000 mL | Freq: Once | RESPIRATORY_TRACT | Status: AC
  Administered 2022-07-20: 3 mL via RESPIRATORY_TRACT
  Filled 2022-07-20: qty 3

## 2022-07-20 NOTE — ED Provider Notes (Signed)
[facility] EMERGENCY DEPARTMENT Provider Note   CSN: [id number] Arrival date & time: 07/20/22  1350     History  Chief Complaint  Patient presents with   Shortness of Breath    Lee Meyer is a 37 y.o. male with PMH significant for asthma, interstitial lung disease who presents with cough, shortness of breath.  Patient recently with PFTs, has follow-up with PCP tomorrow.  Patient reports that he had been taking albuterol, used it 4 times today with no significant relief.  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 Additional reports exacerbated by temperature changes.   Shortness of Breath      Home Medications Prior to Admission medications   Medication Sig Start Date End Date Taking? Authorizing Provider  albuterol (VENTOLIN HFA) 108 (90 Base) MCG/ACT inhaler Inhale 2 puffs into the lungs every 6 (six) hours as needed for wheezing or shortness of breath. 07/14/22   Johnette Abraham, MD  benzonatate (TESSALON) 100 MG capsule Take 1 capsule (100 mg total) by mouth every 8 (eight) hours. 06/16/22   Redwine, Madison A, PA-C  Budesonide (PULMICORT FLEXHALER) 90 MCG/ACT inhaler Inhale 2 puffs into the lungs 2 (two) times daily. 05/15/22   Lyndal Pulley, MD  fluticasone (FLOVENT HFA) 44 MCG/ACT inhaler Inhale 2 puffs into the lungs in the morning and at bedtime. 05/18/22   Johnette Abraham, MD  predniSONE (DELTASONE) 20 MG tablet Take 2 tablets (40 mg total) by mouth daily. 07/20/22   Jhoselyn Ruffini H, PA-C      Allergies    Patient has no known allergies.    Review of Systems   Review of Systems  Respiratory:  Positive for shortness of breath.   All other systems reviewed and are negative.   Physical Exam Updated Vital Signs BP 137/87 (BP Location: Right Arm)   Pulse (!) 116   Temp 98 F (36.7 C) (Oral)   Resp (!) 22   Ht 5\' 8"  (1.727 m)   Wt 66.7 kg   SpO2 95%   BMI 22.35 kg/m  Physical Exam Vitals and nursing  note reviewed.  Constitutional:      General: He is not in acute distress.    Appearance: Normal appearance.  HENT:     Head: Normocephalic and atraumatic.  Eyes:     General:        Right eye: No discharge.        Left eye: No discharge.  Cardiovascular:     Rate and Rhythm: Regular rhythm. Tachycardia present.     Heart sounds: No murmur heard.    No friction rub. No gallop.  Pulmonary:     Effort: Pulmonary effort is normal.     Breath sounds: Normal breath sounds.     Comments: Patient with biphasic wheezing, without rhonchi, stridor, rales, he is not in significant respiratory distress, some mild tachypnea Abdominal:     General: Bowel sounds are normal.     Palpations: Abdomen is soft.  Skin:    General: Skin is warm and dry.     Capillary Refill: Capillary refill takes less than 2 seconds.  Neurological:     Mental Status: He is alert and oriented to person, place, and time.  Psychiatric:        Mood and Affect: Mood normal.        Behavior: Behavior normal.     ED Results / Procedures / Treatments  Labs (all labs ordered are listed, but only abnormal results are displayed) Labs Reviewed - No data to display  EKG None  Radiology DG Chest 2 View  Result Date: 07/20/2022 CLINICAL DATA:  Cough and congestion.  Asthma EXAM: CHEST - 2 VIEW COMPARISON:  06/16/2022 FINDINGS: The heart size and mediastinal contours are within normal limits. Both lungs are clear. Only mild peribronchial thickening. Mild interstitial prominence. The visualized skeletal structures are unremarkable. IMPRESSION: Mild interstitial changes and peribronchial thickening. No consolidation Electronically Signed   By: Jill Side M.D.   On: 07/20/2022 14:25    Procedures Procedures    Medications Ordered in ED Medications  ipratropium-albuterol (DUONEB) 0.5-2.5 (3) MG/3ML nebulizer solution 3 mL (3 mLs Nebulization Given 07/20/22 1527)    ED Course/ Medical Decision Making/ A&P Clinical  Course as of 07/20/22 1547  Thu Jul 20, 2022  1455 Hx of asthma (?), presenting shob since 1:30 this morning. Had cold last week, temperature changes, works with some inhalation at work. Questionable interstitial lung disease -- recent PFTs. Diffuse wheezing on exam. SpO2 ~ 96. Moderate tachypnea. Has albuterol at home, has been using without significant relief.  [CP]    Clinical Course User Index [CP] Anselmo Pickler, PA-C                           Medical Decision Making Amount and/or Complexity of Data Reviewed Radiology: ordered.  Risk Prescription drug management.   This patient is a 37 y.o. male  who presents to the ED for concern of shortness of breath, concern for asthma or other breathing difficulty exacerbation.   Differential diagnoses prior to evaluation: The emergent differential diagnosis includes, but is not limited to,  asthma exacerbation, COPD exacerbation, acute upper respiratory infection, acute bronchitis, chronic bronchitis, interstitial lung disease, ARDS, PE, pneumonia, atypical ACS, carbon monoxide poisoning, spontaneous pneumothorax, new CHF vs CHF exacerbation, versus other . This is not an exhaustive differential.   Past Medical History / Co-morbidities: Asthma, acid reflux, recent PFTs may suggest more of an interstitial lung disease pattern  Additional history: Chart reviewed. Pertinent results include: Reviewed PFTs, outpatient family medicine  Physical Exam: Physical exam performed. The pertinent findings include: Patient with tachycardia without acute arrhythmia, he has mild tachypnea, biphasic wheezing, but not in respiratory distress, he is afebrile with overall stable oxygen saturation around 95%. Patient with significant improvement of wheezing s/p duoneb.  Lab Tests/Imaging studies: I independently interpreted plain film chest x-ray which shows some peribronchial cuffing and other diffuse interstitial changes without any focal consolidation  or evidence of acute pneumonia.  I agree with the radiologist interpretation.  Cardiac monitoring: EKG obtained and interpreted by my attending physician which shows: Sinus tachycardia, no evidence of ischemia   Medications: I ordered medication including DuoNeb x 1, patient with improved respiration after DuoNeb.  I have reviewed the patients home medicines and have made adjustments as needed.  Charged on I think that he would benefit from steroid burst for asthma exacerbation and close PCP follow-up in the morning, seems clear that he should be on some preventative asthma medications, but has had financial difficulties in the past so I will defer this conversation for his PCP  Disposition: After consideration of the diagnostic results and the patients response to treatment, I feel that patient is stable for discharge at this time .   emergency department workup does not suggest an emergent condition requiring admission or immediate intervention beyond  what has been performed at this time. The plan is: as above. The patient is safe for discharge and has been instructed to return immediately for worsening symptoms, change in symptoms or any other concerns.  Final Clinical Impression(s) / ED Diagnoses Final diagnoses:  Moderate persistent asthma with exacerbation  SOB (shortness of breath)    Rx / DC Orders ED Discharge Orders          Ordered    predniSONE (DELTASONE) 20 MG tablet  Daily,   Status:  Discontinued        07/20/22 1544    predniSONE (DELTASONE) 20 MG tablet  Daily        07/20/22 1545              Sherril Heyward, Harrel Carina, PA-C 07/20/22 1547    Glendora Score, MD 07/20/22 2144

## 2022-07-20 NOTE — ED Notes (Signed)
Patient transported to X-ray 

## 2022-07-20 NOTE — ED Triage Notes (Signed)
Pt with SOB since last night while at work.  Pt noted with cough in triage. Pt has used inhaler without relief.  Pt with c/o CP as well.

## 2022-07-21 ENCOUNTER — Ambulatory Visit (INDEPENDENT_AMBULATORY_CARE_PROVIDER_SITE_OTHER): Payer: Self-pay | Admitting: Internal Medicine

## 2022-07-21 ENCOUNTER — Encounter: Payer: Self-pay | Admitting: Internal Medicine

## 2022-07-21 VITALS — BP 142/88 | HR 103 | Ht 68.0 in | Wt 135.4 lb

## 2022-07-21 DIAGNOSIS — Z23 Encounter for immunization: Secondary | ICD-10-CM

## 2022-07-21 DIAGNOSIS — J449 Chronic obstructive pulmonary disease, unspecified: Secondary | ICD-10-CM

## 2022-07-21 MED ORDER — IPRATROPIUM BROMIDE HFA 17 MCG/ACT IN AERS: 2.0000 | INHALATION_SPRAY | Freq: Four times a day (QID) | RESPIRATORY_TRACT | 12 refills | Status: DC | PRN

## 2022-07-21 NOTE — Addendum Note (Signed)
Addended by: Marland Kitchen E on: 07/21/2022 05:21 PM   Modules accepted: Level of Service

## 2022-07-21 NOTE — Assessment & Plan Note (Signed)
Influenza vaccine administered today.

## 2022-07-21 NOTE — Progress Notes (Signed)
Established Patient Office Visit  Subjective   Patient ID: Lee Meyer, male    DOB: 11-Jan-1986  Age: 37 y.o. MRN: [id number]  Chief Complaint  Patient presents with   Asthma    Following up from Dr Noni Saupe visit in Nov   Mr. [name] returns to care today for follow-up of pulmonary concerns.  He was last seen at [facility] on 05/15/22 by Dr. Barbaraann Faster as a new patient establishing care.  At that time he endorsed a history of asthma.  No PFTs were on file.  He additionally endorsed recent symptoms of chest tightness, dyspnea, and wheezing.  He stated that the symptoms have been present for 3 years.  Pulmicort was prescribed as maintenance therapy and albuterol was prescribed for as needed use.  Labs and PFTs were ordered.  4-week follow-up was arranged.  PFTs subsequently demonstrated severe obstructive airway disease with no significant bronchodilator response.  Moderate diffusion defect was appreciated as well.  In the interim, Mr. [name] has presented to the emergency department on 4 occasions for upper respiratory symptoms.  He most recently presented yesterday (1/11) endorsing shortness of breath, cough, and wheezing.  He reports waking up yesterday morning and feeling as though he could not catch his breath.  His symptoms did not improve despite using his albuterol inhaler.  In the ED he received DuoNeb treatment x 1 and symptomatically improved.  He was discharged on prednisone 40 mg x 5 days.  Today Mr. [name] states that his work of breathing has mildly improved. He continues to experiences dyspnea with exertion and has a cough productive of cloudy sputum. He reports that he was never able to fill Pulmicort of Flovent due to cost. His insurance has or will be changing soon and he his hopeful this will alleviate some of the previous cost barriers he has encountered. Mr. [name] stopped smoking cigarettes two months ago. He had previously smoked 2-3 packs per week for 10 years. He reports that he currently works  in a [employer] and has also work around saw dust previously. He endorses a family history of asthma in his mother but is unaware of any other history of pulmonary disease.  Past Medical History:  Diagnosis Date   Asthma    GERD (gastroesophageal reflux disease)    History reviewed. No pertinent surgical history. Social History   Tobacco Use   Smoking status: Some Days    Types: Cigars   Smokeless tobacco: Never   Tobacco comments:    Cigarettes/ Black & Milds  Vaping Use   Vaping Use: Never used  Substance Use Topics   Alcohol use: Yes    Alcohol/week: 1.0 standard drink of alcohol    Types: 1 Cans of beer per week    Comment: occ   Drug use: Not Currently    Types: Marijuana   Family History  Problem Relation Age of Onset   Asthma Mother    No Known Allergies  Review of Systems  Constitutional:  Negative for chills and fever.  Respiratory:  Positive for cough, sputum production, shortness of breath and wheezing.       Objective:     BP (!) 142/88   Pulse (!) 103   Ht 5\' 8"  (1.727 m)   Wt 135 lb 6.4 oz (61.4 kg)   SpO2 93%   BMI 20.59 kg/m  BP Readings from Last 3 Encounters:  07/21/22 (!) 142/88  07/20/22 (!) 130/104  06/24/22 113/71  Physical Exam Vitals reviewed.  Constitutional:      General: He is not in acute distress.    Appearance: Normal appearance. He is not ill-appearing.  HENT:     Head: Normocephalic and atraumatic.     Right Ear: External ear normal.     Left Ear: External ear normal.     Nose: Nose normal. No congestion or rhinorrhea.     Mouth/Throat:     Mouth: Mucous membranes are moist.     Pharynx: Oropharynx is clear.  Eyes:     General: No scleral icterus.    Extraocular Movements: Extraocular movements intact.     Conjunctiva/sclera: Conjunctivae normal.     Pupils: Pupils are equal, round, and reactive to light.  Cardiovascular:     Rate and Rhythm: Normal rate and regular rhythm.     Pulses: Normal  pulses.     Heart sounds: Normal heart sounds. No murmur heard. Pulmonary:     Effort: Pulmonary effort is normal.     Breath sounds: Wheezing (diffuse, bilateral) present. No rhonchi or rales.  Abdominal:     General: Abdomen is flat. Bowel sounds are normal. There is no distension.     Palpations: Abdomen is soft.     Tenderness: There is no abdominal tenderness.  Musculoskeletal:        General: No swelling or deformity. Normal range of motion.     Cervical back: Normal range of motion.  Skin:    General: Skin is warm and dry.     Capillary Refill: Capillary refill takes less than 2 seconds.  Neurological:     General: No focal deficit present.     Mental Status: He is alert and oriented to person, place, and time.     Motor: No weakness.  Psychiatric:        Mood and Affect: Mood normal.        Behavior: Behavior normal.        Thought Content: Thought content normal.    Last CBC Lab Results  Component Value Date   WBC 7.4 05/15/2022   HGB 15.6 05/15/2022   HCT 46.8 05/15/2022   MCV 84 05/15/2022   MCH 27.9 05/15/2022   RDW 14.0 05/15/2022   PLT 232 02/54/2706   Last metabolic panel Lab Results  Component Value Date   GLUCOSE 84 05/15/2022   NA 142 05/15/2022   K 4.6 05/15/2022   CL 104 05/15/2022   CO2 24 05/15/2022   BUN 12 05/15/2022   CREATININE 0.87 05/15/2022   EGFR 114 05/15/2022   CALCIUM 9.9 05/15/2022   PROT 7.2 11/07/2013   ALBUMIN 4.0 11/07/2013   BILITOT 0.2 (L) 11/07/2013   ALKPHOS 71 11/07/2013   AST 28 11/07/2013   ALT 32 11/07/2013   ANIONGAP 12 12/16/2020     Assessment & Plan:   Problem List Items Addressed This Visit       Obstructive airway disease ([place]) - Primary    He has previously endorsed a history of asthma, however PFTs from 05/15/22 show severe obstructive airway disease with no reversible bronchodilator response.  Moderate diffusion defect present as well.  He has presented to the emergency department 4 times since  November for respiratory concerns, most recently yesterday (1/11).  ICS has previously been prescribed, however he has not been able to fill prescriptions due to cost.  He reports today that his insurance has or will be changing soon.  No coverage is currently on record.  Unclear  etiology of symptoms.  Labs remarkable for eosinophilia, which be consistent with a history of asthma, however PFTs suggest otherwise. -Pulmonology referral placed today for further workup and management -I have added ipratropium as needed.  Ideally he can start maintenance therapy, however now cough is prohibitive until his insurance situation is clarified -Continue prednisone as prescribed by the ED on 1/11 -We will tentatively plan for follow-up in 4 weeks      Need for influenza vaccination    Influenza vaccine administered today      Return in about 4 weeks (around 08/18/2022).    Johnette Abraham, MD

## 2022-07-21 NOTE — Patient Instructions (Signed)
It was a pleasure to see you today.  Thank you for giving [place] the opportunity to be involved in your care.  Below is a brief recap of your visit and next steps.  We will plan to see you again in 4 weeks.  Summary I have prescribed ipratropium for as needed use for shortness of breath You have been referred to pulmonology for obstructive airway disease. We will follow up in 4 weeks.

## 2022-07-21 NOTE — Assessment & Plan Note (Signed)
He has previously endorsed a history of asthma, however PFTs from 05/15/22 show severe obstructive airway disease with no reversible bronchodilator response.  Moderate diffusion defect present as well.  He has presented to the emergency department 4 times since November for respiratory concerns, most recently yesterday (1/11).  ICS has previously been prescribed, however he has not been able to fill prescriptions due to cost.  He reports today that his insurance has or will be changing soon.  No coverage is currently on record.  Unclear etiology of symptoms.  Labs remarkable for eosinophilia, which be consistent with a history of asthma, however PFTs suggest otherwise. -Pulmonology referral placed today for further workup and management -I have added ipratropium as needed.  Ideally he can start maintenance therapy, however now cough is prohibitive until his insurance situation is clarified -Continue prednisone as prescribed by the ED on 1/11 -We will tentatively plan for follow-up in 4 weeks

## 2022-07-27 ENCOUNTER — Ambulatory Visit (INDEPENDENT_AMBULATORY_CARE_PROVIDER_SITE_OTHER): Payer: BC Managed Care – PPO | Admitting: Internal Medicine

## 2022-07-27 ENCOUNTER — Encounter: Payer: Self-pay | Admitting: Internal Medicine

## 2022-07-27 VITALS — BP 140/86 | HR 74 | Temp 97.8°F | Ht 68.0 in | Wt 134.8 lb

## 2022-07-27 DIAGNOSIS — J4550 Severe persistent asthma, uncomplicated: Secondary | ICD-10-CM

## 2022-07-27 MED ORDER — BREZTRI AEROSPHERE 160-9-4.8 MCG/ACT IN AERO: 2.0000 | INHALATION_SPRAY | Freq: Two times a day (BID) | RESPIRATORY_TRACT | 11 refills | Status: AC

## 2022-07-27 NOTE — Progress Notes (Signed)
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    History of Present Illness  07/27/2022  Pulmonary/ 1st office eval/ Lee Meyer / [employer] Office  Chief Complaint  Patient presents with   Consult    Consult SOB   Dyspnea:  worse with extreme works Cough: bad in am / white  Sleep: occ wakes up with cough / uses Lee twice a week at hs Lee use: twice a day / better on inhaler  02: none     Past Medical History:  Diagnosis Date   Asthma    GERD (gastroesophageal reflux disease)     Outpatient Medications Prior to Visit  Medication Sig Dispense Refill   albuterol (VENTOLIN HFA) 108 (90 Base) MCG/ACT inhaler Inhale 2 puffs into the lungs every 6 (six) hours as needed for wheezing or shortness of breath. 8 g 2       12   benzonatate (TESSALON) 100 MG capsule Take 1 capsule (100 mg total) by mouth every 8 (eight) hours. 21 capsule 0       0       Objective:     BP (!) 140/86   Pulse 74   Temp 97.8 F (36.6 C)   Ht 5\' 8"  (1.727 m)   Wt 134 lb 12.8 oz (61.1 kg)   SpO2 96% Comment: ra  BMI 20.50 kg/m   SpO2: 96 % (ra)   HEENT : Oropharynx  clear          NECK :  without  apparent JVD/ palpable Nodes/TM    LUNGS: no acc muscle use,  Nl contour chest which is clear to A and P bilaterally without cough on insp or exp maneuvers   CV:  RRR  no s3 or murmur or increase in P2, and no edema   ABD:  soft and nontender with nl inspiratory excursion in the supine position. No bruits or organomegaly appreciated   MS:  Nl gait/ ext warm without deformities Or obvious joint restrictions  calf tenderness, cyanosis or clubbing    SKIN: warm and dry without lesions    NEURO:  alert, approp, nl sensorium with  no motor or cerebellar deficits apparent.      I personally reviewed images and agree with radiology impression as follows:  CXR:   07/20/2022  Mild interstitial changes and peribronchial thickening. No consolidation    Assessment   Asthmatic bronchitis, severe persistent, uncomplicated Stopped smoking 05/2022  - Onset around age 3   - PFT's  05/15/22   FEV1 1.23 (30% ) ratio 0.50 p no % improvement from Lee p ?Lee prior to study with DLCO  18.98 (63 %)   and FV curve atypical/min concavity   - 07/27/2022   Walked on RA  x  3  lap(s) =  approx 450  ft  @ fast pace, stopped due to end of study with lowest 02 sats 94% s sob  - 07/27/2022  After extensive coaching inhaler device,  effectiveness =    80% > breztri trial  - Allergy screen 07/27/2022 >  Eos 0. /  IgE  pending   Unusual pattern of severity s much concavity on f/v so I doubt this is  typical copd, esp at age 37, unless pt has h/o premature birth or heavy environmental smoke exp as child but the rx is the same:  Breztri trial  x 6 weeks then regroup   Advised on rule of 2's - see action plan   Each maintenance medication was reviewed in detail including emphasizing most importantly the difference between maintenance and prns and under what circumstances the prns are to be triggered using an action plan format where appropriate.  Total time for H and P, chart review, counseling, reviewing hfa device(s) , directly observing portions of ambulatory 02 saturation study/ and generating customized AVS unique to this office visit / same day charting  > 45 min new pt eval                   Lee Gully, MD 07/27/2022

## 2022-07-27 NOTE — Patient Instructions (Addendum)
Ask your mom if you were born prematurely or stayed in hospital longer than a day or pneumonia as child   Plan A = Automatic = Always=    Breztri Take 2 puffs first thing in am and then another 2 puffs about 12 hours later.   Work on inhaler technique:  relax and gently blow all the way out then take a nice smooth full deep breath back in, triggering the inhaler at same time you start breathing in.  Hold breath in for at least  5 seconds if you can. Blow out breztri  thru nose. Rinse and gargle with water when done.  If mouth or throat bother you at all,  try brushing teeth/gums/tongue with arm and hammer toothpaste/ make a slurry and gargle and spit out.     Plan B = Backup (to supplement plan A, not to replace it) Only use your albuterol inhaler as a rescue medication to be used if you can't catch your breath by resting or doing a relaxed purse lip breathing pattern.  - The less you use it, the better it will work when you need it. - Ok to use the inhaler up to 2 puffs  every 4 hours if you must but call for appointment if use goes up over your usual need - Don't leave home without it !!  (think of it like starter fluid for your car)   Also  Ok to try albuterol 15 min before an activity (on alternating days)  that you know would usually make you short of breath and see if it makes any difference and if makes none then don't take albuterol after activity unless you can't catch your breath as this means it's the resting that helps, not the albuterol.      Please remember to go to the lab department   for your tests - we will call you with the results when they are available.      Please schedule a follow up visit in 6 weeks but call sooner if needed  with all medications /inhalers/ solutions in hand so we can verify exactly what you are taking. This includes all medications from all doctors and over the counters

## 2022-07-27 NOTE — Assessment & Plan Note (Addendum)
Stopped smoking 05/2022  - Onset around age 37   - PFT's  05/15/22   FEV1 1.23 (30% ) ratio 0.50 p no % improvement from saba p ?saba prior to study with DLCO  18.98 (63 %)   and FV curve atypical/min concavity   - 07/27/2022   Walked on RA  x  3  lap(s) =  approx 450  ft  @ fast pace, stopped due to end of study with lowest 02 sats 94% s sob  - 07/27/2022  After extensive coaching inhaler device,  effectiveness =    80% > breztri trial  - Allergy screen 07/27/2022 >  Eos 0. /  IgE  pending   Unusual pattern of severity s much concavity on f/v so I doubt this is typical copd, esp at age 69, unless pt has h/o premature birth or heavy environmental smoke exp as child but the rx is the same:  Breztri trial  x 6 weeks then regroup   Advised on rule of 2's - see action plan   Each maintenance medication was reviewed in detail including emphasizing most importantly the difference between maintenance and prns and under what circumstances the prns are to be triggered using an action plan format where appropriate.  Total time for H and P, chart review, counseling, reviewing hfa device(s) , directly observing portions of ambulatory 02 saturation study/ and generating customized AVS unique to this office visit / same day charting  > 45 min new pt eval

## 2022-07-29 LAB — CBC WITH DIFFERENTIAL/PLATELET
Basophils Absolute: 0.1 10*3/uL (ref 0.0–0.2)
Basos: 1 %
EOS (ABSOLUTE): 0.4 10*3/uL (ref 0.0–0.4)
Eos: 8 %
Hematocrit: 47 % (ref 37.5–51.0)
Hemoglobin: 16 g/dL (ref 13.0–17.7)
Immature Grans (Abs): 0 10*3/uL (ref 0.0–0.1)
Immature Granulocytes: 0 %
Lymphocytes Absolute: 3 10*3/uL (ref 0.7–3.1)
Lymphs: 60 %
MCH: 28.7 pg (ref 26.6–33.0)
MCHC: 34 g/dL (ref 31.5–35.7)
MCV: 84 fL (ref 79–97)
Monocytes Absolute: 0.6 10*3/uL (ref 0.1–0.9)
Monocytes: 12 %
Neutrophils Absolute: 0.9 10*3/uL — ABNORMAL LOW (ref 1.4–7.0)
Neutrophils: 19 %
Platelets: 197 10*3/uL (ref 150–450)
RBC: 5.57 x10E6/uL (ref 4.14–5.80)
RDW: 13.5 % (ref 11.6–15.4)
WBC: 5 10*3/uL (ref 3.4–10.8)

## 2022-07-29 LAB — IGE: IgE (Immunoglobulin E), Serum: 149 IU/mL (ref 6–495)

## 2022-08-21 ENCOUNTER — Ambulatory Visit: Payer: 59 | Admitting: Internal Medicine

## 2022-09-06 NOTE — Progress Notes (Signed)
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  Work on inhaler technique:   Plan B = Backup (to supplement plan A, not to replace it) Only use your albuterol inhaler as a rescue medication Also  Ok to try albuterol 15 min before an activity (on alternating days)  that you know would usually make you short of breath  Please schedule a follow up visit in 6 weeks but call sooner if needed  with all medications /inhalers/ solutions in hand   Allergy screen 07/27/2022 >  Eos 0.4 /  IgE  129  09/07/2022  f/u ov/[place] office/Charitie Hinote re: AB maint on breztri / mom says not born prematurely Gaffer Complaint  Patient presents with   Follow-up    6 week follow up    Dyspnea:  overall much better  Cough: gone  Sleeping: ok SABA use: much less if at all       No obvious day to day or daytime variability or assoc excess/ purulent sputum or mucus plugs or hemoptysis or cp or chest tightness, subjective wheeze or overt sinus or hb symptoms.   Sleeping  without nocturnal  or early am exacerbation  of respiratory  c/o's or need for noct saba. Also denies any obvious fluctuation of symptoms with weather or  environmental changes or other aggravating or alleviating factors except as outlined above   No unusual exposure hx or h/o childhood pna/ asthma or knowledge of premature birth.  Current Allergies, Complete Past Medical History, Past Surgical History, Family History, and Social History were reviewed in [employer] record.  ROS  The following are not active complaints unless bolded Hoarseness, sore throat, dysphagia, dental problems, itching, sneezing,  nasal congestion or discharge of excess mucus or purulent secretions, ear ache,   fever, chills, sweats, unintended wt loss or wt gain, classically pleuritic or exertional cp,  orthopnea pnd or arm/hand swelling  or leg swelling, presyncope, palpitations, abdominal pain, anorexia, nausea, vomiting, diarrhea  or change in bowel habits or change in bladder habits, change in stools or change in urine, dysuria, hematuria,  rash, arthralgias, visual complaints, headache, numbness, weakness or ataxia or problems with walking or coordination,  change in mood or  memory.        Current Meds  Medication Sig   albuterol (VENTOLIN HFA) 108 (90 Base) MCG/ACT inhaler Inhale 2 puffs into the lungs every 6 (six) hours as needed for wheezing or shortness of breath.   Budeson-Glycopyrrol-Formoterol (BREZTRI AEROSPHERE)  160-9-4.8 MCG/ACT AERO Inhale 2 puffs into the lungs 2 (two) times daily. Take 2 puffs first thing in am and then another 2 puffs about 12 hours later.          Past Medical History:  Diagnosis Date   Asthma    GERD (gastroesophageal reflux disease)         Objective:    Wts  BP 113/75   Pulse 75   Ht 5\' 8"  (1.727 m)   Wt 142 lb (64.4 kg)   SpO2 96%  RA   BMI 21.59 kg/m   BSA 1.76 m      09/07/2022        14   07/27/22 134 lb 12.8 oz (61.1 kg)  07/21/22 135 lb 6.4 oz (61.4 kg)  07/20/22 147 lb (66.7 kg)      Vital signs reviewedf from Int med ov same AM   09/07/2022      General appearance:     amb pleasant bm nad   HEENT : Oropharynx  clear        NECK :  without  apparent JVD/ palpable Nodes/TM    LUNGS: no acc muscle use,  Nl contour chest which is clear to A and P bilaterally without cough on insp or exp maneuvers   CV:  RRR  no s3 or murmur or increase in P2, and no edema   ABD:  soft and nontender    MS:  Nl gait/ ext warm without deformities Or obvious joint restrictions  calf tenderness, cyanosis or clubbing    SKIN: warm and dry without lesions    NEURO:  alert, approp, nl sensorium with  no motor or cerebellar deficits apparent         Assessment

## 2022-09-07 ENCOUNTER — Encounter: Payer: Self-pay | Admitting: Internal Medicine

## 2022-09-07 ENCOUNTER — Ambulatory Visit (INDEPENDENT_AMBULATORY_CARE_PROVIDER_SITE_OTHER): Payer: BC Managed Care – PPO | Admitting: Internal Medicine

## 2022-09-07 VITALS — BP 113/75 | HR 75 | Ht 68.0 in | Wt 142.0 lb

## 2022-09-07 DIAGNOSIS — J4550 Severe persistent asthma, uncomplicated: Secondary | ICD-10-CM

## 2022-09-07 NOTE — Patient Instructions (Signed)
It was a pleasure to see you today.  Thank you for giving [place] the opportunity to be involved in your care.  Below is a brief recap of your visit and next steps.  We will plan to see you again in 6 months.  Summary No medication changes today We will follow up in 6 months

## 2022-09-07 NOTE — Assessment & Plan Note (Signed)
PFTs from November 2023 demonstrated severe obstructive airway disease with no reversible bronchodilator response.  Moderate diffusion defect present as well.  He has establish care with pulmonology (Dr. Melvyn Novas) and has started [facility].  Frequency of rescue inhaler use has significantly improved as well.  Respiratory symptoms have significantly improved.  Faint expiratory wheezes appreciated on exam today.  His exam is otherwise unremarkable. -No medication changes today -He will follow-up with Dr. Melvyn Novas later this morning.

## 2022-09-07 NOTE — Assessment & Plan Note (Signed)
Stopped smoking 05/2022  - Onset around age 37   - PFT's  05/15/22   FEV1 1.23 (30% ) ratio 0.50 p no % improvement from saba p ?saba prior to study with DLCO  18.98 (63 %)   and FV curve atypical/min concavity   - 07/27/2022   Walked on RA  x  3  lap(s) =  approx 450  ft  @ fast pace, stopped due to end of study with lowest 02 sats 94% s sob  - 07/27/2022  After extensive coaching inhaler device,  effectiveness =    80% > breztri trial  - Allergy screen 07/27/2022 >  Eos 0.4 /  IgE  129     Group D (now reclassified as E) in terms of symptom/risk and laba/lama/ICS  therefore appropriate rx at this point >>>  continue breztri and approp saba   F/u q 6 m, call sooner if needed          Each maintenance medication was reviewed in detail including emphasizing most importantly the difference between maintenance and prns and under what circumstances the prns are to be triggered using an action plan format where appropriate.  Total time for H and P, chart review, counseling, reviewing hfa  device(s) and generating customized AVS unique to this office visit / same day charting = 94mn

## 2022-09-07 NOTE — Progress Notes (Signed)
Established Patient Office Visit  Subjective   Patient ID: Lee Meyer, male    DOB: 1985/07/25  Age: 37 y.o. MRN: [id number]  Chief Complaint  Patient presents with   Asthma    Follow up   Mr. Lee Meyer returns to care today for follow-up of obstructive airway disease.  He was last seen by me on 1/12 at which time he continued to endorse shortness of breath and chest tightness.  He had presented to the emergency department 4 times with respiratory symptoms since his previous appointment in November 2023.  He has been unable to start maintenance inhaler due to insurance issues.  I added ipratropium to his rescue inhaler regimen and placed a referral to pulmonology.  He was able to establish care with pulmonology (Dr. Melvyn Meyer) on 1/18.  Breztri was added for maintenance therapy.  There have been no acute interval events or ED presentations for respiratory symptoms.  Mr. Lee Meyer reports feeling well today he states that his respiratory status has significantly improved since starting Breztri.  Over the last month he has needed to use his rescue inhaler 4 times.  He has no additional concerns to discuss today.  Past Medical History:  Diagnosis Date   Asthma    GERD (gastroesophageal reflux disease)    History reviewed. No pertinent surgical history. Social History   Tobacco Use   Smoking status: Former    Types: Cigars    Quit date: 05/29/2022    Years since quitting: 0.2   Smokeless tobacco: Never   Tobacco comments:    Cigarettes/ Black & Milds  Vaping Use   Vaping Use: Never used  Substance Use Topics   Alcohol use: Yes    Alcohol/week: 1.0 standard drink of alcohol    Types: 1 Cans of beer per week    Comment: occ   Drug use: Not Currently    Types: Marijuana   Family History  Problem Relation Age of Onset   Asthma Mother    No Known Allergies  Review of Systems  Constitutional:  Negative for chills and fever.  HENT:  Negative for sore throat.   Respiratory:  Negative for  cough and shortness of breath.   Cardiovascular:  Negative for chest pain, palpitations and leg swelling.  Gastrointestinal:  Negative for abdominal pain, blood in stool, constipation, diarrhea, nausea and vomiting.  Genitourinary:  Negative for dysuria and hematuria.  Musculoskeletal:  Negative for myalgias.  Skin:  Negative for itching and rash.  Neurological:  Negative for dizziness and headaches.  Psychiatric/Behavioral:  Negative for depression and suicidal ideas.      Objective:     BP 113/75   Pulse 75   Ht '5\' 8"'$  (1.727 m)   Wt 142 lb (64.4 kg)   SpO2 96%   BMI 21.59 kg/m  BP Readings from Last 3 Encounters:  09/07/22 113/75  07/27/22 (!) 140/86  07/21/22 (!) 142/88   Physical Exam Vitals reviewed.  Constitutional:      General: He is not in acute distress.    Appearance: Normal appearance. He is not ill-appearing.  HENT:     Head: Normocephalic and atraumatic.     Right Ear: External ear normal.     Left Ear: External ear normal.     Nose: Nose normal. No congestion or rhinorrhea.     Mouth/Throat:     Mouth: Mucous membranes are moist.     Pharynx: Oropharynx is clear.  Eyes:     General: No scleral  icterus.    Extraocular Movements: Extraocular movements intact.     Conjunctiva/sclera: Conjunctivae normal.     Pupils: Pupils are equal, round, and reactive to light.  Cardiovascular:     Rate and Rhythm: Normal rate and regular rhythm.     Pulses: Normal pulses.     Heart sounds: Normal heart sounds. No murmur heard. Pulmonary:     Effort: Pulmonary effort is normal.     Breath sounds: Wheezing (Faint expiratory wheezes in right upper lobe) present. No rhonchi or rales.  Abdominal:     General: Abdomen is flat. Bowel sounds are normal. There is no distension.     Palpations: Abdomen is soft.     Tenderness: There is no abdominal tenderness.  Musculoskeletal:        General: No swelling or deformity. Normal range of motion.     Cervical back: Normal  range of motion.  Skin:    General: Skin is warm and dry.     Capillary Refill: Capillary refill takes less than 2 seconds.  Neurological:     General: No focal deficit present.     Mental Status: He is alert and oriented to person, place, and time.     Motor: No weakness.  Psychiatric:        Mood and Affect: Mood normal.        Behavior: Behavior normal.        Thought Content: Thought content normal.   Last CBC Lab Results  Component Value Date   WBC 5.0 07/27/2022   HGB 16.0 07/27/2022   HCT 47.0 07/27/2022   MCV 84 07/27/2022   MCH 28.7 07/27/2022   RDW 13.5 07/27/2022   PLT 197 0000000   Last metabolic panel Lab Results  Component Value Date   GLUCOSE 84 05/15/2022   NA 142 05/15/2022   K 4.6 05/15/2022   CL 104 05/15/2022   CO2 24 05/15/2022   BUN 12 05/15/2022   CREATININE 0.87 05/15/2022   EGFR 114 05/15/2022   CALCIUM 9.9 05/15/2022   PROT 7.2 11/07/2013   ALBUMIN 4.0 11/07/2013   BILITOT 0.2 (L) 11/07/2013   ALKPHOS 71 11/07/2013   AST 28 11/07/2013   ALT 32 11/07/2013   ANIONGAP 12 12/16/2020     Assessment & Plan:   Problem List Items Addressed This Visit       Asthmatic bronchitis, severe persistent, uncomplicated - Primary    PFTs from November 2023 demonstrated severe obstructive airway disease with no reversible bronchodilator response.  Moderate diffusion defect present as well.  He has establish care with pulmonology (Dr. Melvyn Meyer) and has started [facility].  Frequency of rescue inhaler use has significantly improved as well.  Respiratory symptoms have significantly improved.  Faint expiratory wheezes appreciated on exam today.  His exam is otherwise unremarkable. -No medication changes today -He will follow-up with Dr. Melvyn Meyer later this morning.      Return in about 6 months (around 03/08/2023).    Lee Abraham, MD

## 2022-09-07 NOTE — Patient Instructions (Signed)
No change in medications needed   Please schedule a follow up visit in 6 months but call sooner if needed  

## 2022-09-15 ENCOUNTER — Other Ambulatory Visit: Payer: Self-pay | Admitting: Internal Medicine

## 2022-09-15 DIAGNOSIS — J4530 Mild persistent asthma, uncomplicated: Secondary | ICD-10-CM

## 2022-11-23 ENCOUNTER — Telehealth: Payer: Self-pay | Admitting: Internal Medicine

## 2022-12-22 NOTE — Telephone Encounter (Signed)
ED Follow Up Outreach  Initial Outreach Documentation Spoke with pt. 1. .   What specific health condition(s) do you have that needs to be monitored by a doctor? Patient reports Asthma, body aches...   the call got cut off I call back but pt did not answer.... not able to ask rest of questioner.

## 2023-01-10 ENCOUNTER — Other Ambulatory Visit: Payer: Self-pay | Admitting: Internal Medicine

## 2023-01-10 DIAGNOSIS — J4530 Mild persistent asthma, uncomplicated: Secondary | ICD-10-CM

## 2023-05-12 ENCOUNTER — Other Ambulatory Visit: Payer: Self-pay | Admitting: Internal Medicine

## 2023-05-12 DIAGNOSIS — J4530 Mild persistent asthma, uncomplicated: Secondary | ICD-10-CM

## 2023-05-17 ENCOUNTER — Ambulatory Visit: Payer: Self-pay | Admitting: Internal Medicine

## 2023-05-17 ENCOUNTER — Encounter: Payer: Self-pay | Admitting: Internal Medicine

## 2023-05-17 VITALS — BP 111/69 | HR 89 | Ht 68.0 in | Wt 136.0 lb

## 2023-05-17 DIAGNOSIS — J4550 Severe persistent asthma, uncomplicated: Secondary | ICD-10-CM

## 2023-05-17 DIAGNOSIS — F17210 Nicotine dependence, cigarettes, uncomplicated: Secondary | ICD-10-CM

## 2023-05-17 NOTE — Patient Instructions (Signed)
No change in your inhalers   My office will be contacting you by phone for referral for PFTs   - if you don't hear back from my office within one week please call us back or notify us thru MyChart and we'll address it right away.  Please schedule a follow up visit in 6 months but call sooner if needed

## 2023-05-17 NOTE — Progress Notes (Signed)
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  Work on inhaler technique:   Plan B = Backup (to supplement plan A, not to replace it) Only use your albuterol inhaler as a rescue medication Also  Ok to try albuterol 15 min before an activity (on alternating days)  that you know would usually make you short of breath  Please schedule a follow up visit in 6 weeks but call sooner if needed  with all medications /inhalers/ solutions in hand   Allergy screen 07/27/2022 >  Eos 0.4 /  IgE  129  09/07/2022  f/u ov/[place] office/Emmaus Brandi re: COPD ? 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    Also denies any obvious fluctuation of symptoms with weather or environmental changes or other aggravating or alleviating factors except as outlined above   No unusual exposure hx or h/o childhood pna/ asthma or knowledge of premature birth.  Current Allergies, Complete Past Medical History, Past Surgical History, Family History, and Social History were reviewed in [employer] record.  ROS  The following are not active complaints unless bolded Hoarseness, sore throat, dysphagia, dental problems, itching, sneezing,  nasal congestion or discharge of excess mucus or purulent secretions, ear ache,   fever, chills, sweats, unintended wt loss or wt gain, classically pleuritic or exertional cp,  orthopnea pnd or arm/hand swelling  or leg swelling, presyncope, palpitations, abdominal pain, anorexia, nausea, vomiting, diarrhea  or change in bowel habits or change in bladder habits, change in stools  or change in urine, dysuria, hematuria,  rash, arthralgias, visual complaints, headache, numbness, weakness or ataxia or problems with walking or coordination,  change in mood or  memory.        Current Meds  Medication Sig   albuterol (VENTOLIN HFA) 108 (90 Base) MCG/ACT inhaler INHA' 2 PUFFS BY MOUTH EVERY 6 HOURS AS NEEDED FOR WHEEZE OR SHORTNESS OF BREATH   Budeson-Glycopyrrol-Formoterol (BREZTRI AEROSPHERE) 160-9-4.8 MCG/ACT AERO Inhale 2 puffs into the lungs 2 (two) times daily. Take 2 puffs first thing in am and then another 2 puffs about 12 hours later.                    Past Medical History:  Diagnosis Date   Asthma    GERD (gastroesophageal reflux disease)          Objective:     Wts  05/17/2023       136  09/07/2022       142  07/27/22 134 lb 12.8 oz (61.1 kg)  07/21/22 135 lb 6.4 oz (61.4 kg)  07/20/22 147 lb (66.7 kg)     HEENT : Oropharynx  clear      NECK :  without  apparent JVD/ palpable Nodes/TM    LUNGS: no acc muscle use,  Mild barrel  contour chest wall with bilateral  Distant bs s audible wheeze and  without cough on insp or exp maneuvers  and mild  Hyperresonant  to  percussion bilaterally     CV:  RRR  no s3 or murmur or increase in P2, and no edema   ABD:  soft and nontender with pos end  insp Hoover's  in the supine position.  No bruits or organomegaly appreciated   MS:  Nl gait/ ext warm without deformities Or obvious joint restrictions  calf tenderness, cyanosis or clubbing     SKIN: warm and dry without lesions    NEURO:  alert, approp, nl sensorium with  no motor or cerebellar deficits apparent.                 Assessment

## 2023-05-18 DIAGNOSIS — F17210 Nicotine dependence, cigarettes, uncomplicated: Secondary | ICD-10-CM | POA: Insufficient documentation

## 2023-05-18 NOTE — Assessment & Plan Note (Signed)
Stopped smoking 05/2022  - Onset around age 37   - PFT's  05/15/22   FEV1 1.23 (30% ) ratio 0.50 p no % improvement from saba p ?saba prior to study with DLCO  18.98 (63 %)   and FV curve atypical/min concavity   - 07/27/2022   Walked on RA  x  3  lap(s) =  approx 450  ft  @ fast pace, stopped due to end of study with lowest 02 sats 94% s sob  - 07/27/2022   breztri trial  - Allergy screen 07/27/2022 >  Eos 0.4 /  IgE  129 05/17/2023  After extensive coaching inhaler device,  effectiveness =    90%    Group D (now reclassified as E) in terms of symptom/risk and laba/lama/ICS  therefore appropriate rx at this point >>>  breztri 2 bid and prn saba much improved over baseline   >>> f/u with pfts in 3 m, no change rx

## 2023-05-18 NOTE — Assessment & Plan Note (Signed)
Counseled re importance of smoking cessation but did not meet time criteria for separate billing           Each maintenance medication was reviewed in detail including emphasizing most importantly the difference between maintenance and prns and under what circumstances the prns are to be triggered using an action plan format where appropriate.  Total time for H and P, chart review, counseling, reviewing hfa  device(s) and generating customized AVS unique to this office visit / same day charting = 23 min

## 2023-05-18 NOTE — Addendum Note (Signed)
Addended by: Sandrea Hughs B on: 05/18/2023 05:05 AM   Modules accepted: Level of Service

## 2023-08-15 ENCOUNTER — Telehealth: Payer: Self-pay | Admitting: Internal Medicine

## 2023-08-15 NOTE — Telephone Encounter (Signed)
 LVM 05/23/23--05/29/23--06/01/23--06/04/23--for patient to call and discuss scheduling the PFT ordered by Dr. Gladies Lambing 08/15/23---No answer and voice mail is full

## 2023-09-17 ENCOUNTER — Other Ambulatory Visit: Payer: Self-pay | Admitting: Internal Medicine

## 2023-11-26 ENCOUNTER — Other Ambulatory Visit: Payer: Self-pay | Admitting: Internal Medicine

## 2023-11-26 DIAGNOSIS — J4530 Mild persistent asthma, uncomplicated: Secondary | ICD-10-CM

## 2024-02-27 ENCOUNTER — Other Ambulatory Visit: Payer: Self-pay | Admitting: Internal Medicine

## 2024-02-27 DIAGNOSIS — J4530 Mild persistent asthma, uncomplicated: Secondary | ICD-10-CM
